# Patient Record
Sex: Female | Born: 1975 | Race: White | Hispanic: No | Marital: Married | Smoking: Never smoker
Health system: Southern US, Community
[De-identification: ages and names within clinical notes are randomized; demographics above are authoritative.]

## PROBLEM LIST (undated history)

## (undated) HISTORY — PX: NO PAST SURGERIES: SHX2092

---

## 2015-10-22 ENCOUNTER — Encounter: Payer: Self-pay | Admitting: Obstetrics and Gynecology

## 2015-11-03 ENCOUNTER — Other Ambulatory Visit: Payer: BC Managed Care – PPO

## 2015-11-03 ENCOUNTER — Encounter: Payer: Self-pay | Admitting: Obstetrics and Gynecology

## 2015-11-03 ENCOUNTER — Ambulatory Visit (INDEPENDENT_AMBULATORY_CARE_PROVIDER_SITE_OTHER): Payer: BC Managed Care – PPO | Admitting: Obstetrics and Gynecology

## 2015-11-03 VITALS — BP 117/78 | HR 91 | Ht 68.0 in | Wt 229.2 lb

## 2015-11-03 DIAGNOSIS — N912 Amenorrhea, unspecified: Secondary | ICD-10-CM | POA: Diagnosis not present

## 2015-11-03 DIAGNOSIS — Z3481 Encounter for supervision of other normal pregnancy, first trimester: Secondary | ICD-10-CM

## 2015-11-03 DIAGNOSIS — O09521 Supervision of elderly multigravida, first trimester: Secondary | ICD-10-CM | POA: Diagnosis not present

## 2015-11-03 DIAGNOSIS — O09529 Supervision of elderly multigravida, unspecified trimester: Secondary | ICD-10-CM | POA: Insufficient documentation

## 2015-11-03 DIAGNOSIS — N926 Irregular menstruation, unspecified: Secondary | ICD-10-CM

## 2015-11-03 DIAGNOSIS — R638 Other symptoms and signs concerning food and fluid intake: Secondary | ICD-10-CM | POA: Diagnosis not present

## 2015-11-03 DIAGNOSIS — Z3490 Encounter for supervision of normal pregnancy, unspecified, unspecified trimester: Secondary | ICD-10-CM | POA: Insufficient documentation

## 2015-11-03 LAB — POCT URINE PREGNANCY: PREG TEST UR: POSITIVE — AB

## 2015-11-03 NOTE — Progress Notes (Signed)
GYN ENCOUNTER NOTE  Subjective:       Alison Parks is a 40 y.o. 512P1001 female is here for gynecologic evaluation of the following issues:  1Pregnancy confirmation.     Patient was attempting to conceive. She has been taking prenatal vitamins prior to conception.  Patient denies vaginal bleeding, pelvic pain, vomiting, and breast tenderness. She does have mild nausea.  Prenatal risk factors: 1. Advanced maternal age 40. Increased body mass index  Gynecologic History Patient's last menstrual period was 09/02/2015 (exact date). Contraception: none  Obstetric History OB History  Gravida Para Term Preterm AB SAB TAB Ectopic Multiple Living  2 1 1       1     # Outcome Date GA Lbr Len/2nd Weight Sex Delivery Anes PTL Lv  2 Current           1 Term 2007   8 lb 12.8 oz (3.992 kg) M Vag-Spont   Y      History reviewed. No pertinent past medical history.  Past Surgical History  Procedure Laterality Date  . No past surgeries      No current outpatient prescriptions on file prior to visit.   No current facility-administered medications on file prior to visit.    Allergies  Allergen Reactions  . Amoxicillin Rash    Social History   Social History  . Marital Status: Married    Spouse Name: N/A  . Number of Children: N/A  . Years of Education: N/A   Occupational History  . Not on file.   Social History Main Topics  . Smoking status: Never Smoker   . Smokeless tobacco: Not on file  . Alcohol Use: No  . Drug Use: No  . Sexual Activity: Yes    Birth Control/ Protection: None   Other Topics Concern  . Not on file   Social History Narrative  . No narrative on file    Family History  Problem Relation Age of Onset  . Diabetes Father   . Congestive Heart Failure Father   . Breast cancer Paternal Grandmother   . Ovarian cancer Neg Hx   . Colon cancer Neg Hx     The following portions of the patient's history were reviewed and updated as appropriate:  allergies, current medications, past family history, past medical history, past social history, past surgical history and problem list.  Review of Systems  Review of Systems - General ROS: negative for - chills, fatigue, fever, hot flashes, malaise or night sweats Hematological and Lymphatic ROS: negative for - bleeding problems or swollen lymph nodes Gastrointestinal ROS: negative for - abdominal pain, blood in stools, change in bowel habits and vomiting. POSITIVE-nausea Musculoskeletal ROS: negative for - joint pain, muscle pain or muscular weakness Genito-Urinary ROS: negative for - change in menstrual cycle, dysmenorrhea, dyspareunia, dysuria, genital discharge, genital ulcers, hematuria, incontinence, irregular/heavy menses, nocturia or pelvic pain  Objective:   BP 117/78 mmHg  Pulse 91  Ht 5\' 8"  (1.727 m)  Wt 229 lb 3.2 oz (103.964 kg)  BMI 34.86 kg/m2  LMP 09/02/2015 (Exact Date) CONSTITUTIONAL: Well-developed, well-nourished female in no acute distress.  Physical exam deferred    Assessment:   1. Amenorrhea - POCT urine pregnancy  2. Pregnancy  LMP 09/03/2015  EDD 06/08/2016  EGA 8.6 weeks  3. Increased BMI First pregnancy uncomplicated without history of gestational diabetes mellitus, hypertension, preterm labor   4. Advanced maternal age in multigravida, first trimester No known genetic risk factors. Patient is interested in  Cell free DNA testing     Plan:   1. Continue prenatal vitamins 2. Return in 2 weeks for nursing intake visit 3. Return in 3 weeks to see Melody Shambley for new OB history and physical 4. Patient desires Cell Free DNA testing 5. New OB counseling: The patient has been given an overview regarding routine prenatal care. Recommendations regarding diet, weight gain, and exercise in pregnancy were given. Prenatal testing, optional genetic testing, and ultrasound use in pregnancy were reviewed.  Benefits of Breast Feeding were discussed. The  patient is encouraged to consider nursing her baby post partum.  A total of 30 minutes were spent face-to-face with the patient during the encounter with greater than 50% dealing with counseling and coordination of care.   Herold HarmsMartin A Wray Goehring, MD  Note: This dictation was prepared with Dragon dictation along with smaller phrase technology. Any transcriptional errors that result from this process are unintentional.

## 2015-11-03 NOTE — Patient Instructions (Signed)
1. Return in 2 weeks for nursing intake visit 2. Return in 3 weeks for NEW OB history and physical-[facility]

## 2015-11-04 LAB — MONITOR DRUG PROFILE 14(MW)
AMPHETAMINE SCREEN URINE: NEGATIVE ng/mL
BARBITURATE SCREEN URINE: NEGATIVE ng/mL
BENZODIAZEPINE SCREEN, URINE: NEGATIVE ng/mL
BUPRENORPHINE, URINE: NEGATIVE ng/mL
CANNABINOIDS UR QL SCN: NEGATIVE ng/mL
Cocaine (Metab) Scrn, Ur: NEGATIVE ng/mL
Creatinine(Crt), U: 83.1 mg/dL (ref 20.0–300.0)
Fentanyl, Urine: NEGATIVE pg/mL
Meperidine Screen, Urine: NEGATIVE ng/mL
Methadone Screen, Urine: NEGATIVE ng/mL
OXYCODONE+OXYMORPHONE UR QL SCN: NEGATIVE ng/mL
Opiate Scrn, Ur: NEGATIVE ng/mL
PH UR, DRUG SCRN: 6.2 (ref 4.5–8.9)
PHENCYCLIDINE QUANTITATIVE URINE: NEGATIVE ng/mL
PROPOXYPHENE SCREEN URINE: NEGATIVE ng/mL
SPECIFIC GRAVITY: 1.015
Tramadol Screen, Urine: NEGATIVE ng/mL

## 2015-11-04 LAB — CBC WITH DIFFERENTIAL/PLATELET
BASOS ABS: 0 10*3/uL (ref 0.0–0.2)
Basos: 0 %
EOS (ABSOLUTE): 0.1 10*3/uL (ref 0.0–0.4)
Eos: 1 %
HEMOGLOBIN: 14.1 g/dL (ref 11.1–15.9)
Hematocrit: 42.4 % (ref 34.0–46.6)
IMMATURE GRANS (ABS): 0 10*3/uL (ref 0.0–0.1)
Immature Granulocytes: 0 %
LYMPHS: 20 %
Lymphocytes Absolute: 2 10*3/uL (ref 0.7–3.1)
MCH: 29.5 pg (ref 26.6–33.0)
MCHC: 33.3 g/dL (ref 31.5–35.7)
MCV: 89 fL (ref 79–97)
MONOCYTES: 6 %
Monocytes Absolute: 0.6 10*3/uL (ref 0.1–0.9)
NEUTROS PCT: 73 %
Neutrophils Absolute: 7 10*3/uL (ref 1.4–7.0)
Platelets: 208 10*3/uL (ref 150–379)
RBC: 4.78 x10E6/uL (ref 3.77–5.28)
RDW: 13.9 % (ref 12.3–15.4)
WBC: 9.7 10*3/uL (ref 3.4–10.8)

## 2015-11-04 LAB — URINALYSIS, ROUTINE W REFLEX MICROSCOPIC
Bilirubin, UA: NEGATIVE
GLUCOSE, UA: NEGATIVE
Ketones, UA: NEGATIVE
Nitrite, UA: NEGATIVE
PROTEIN UA: NEGATIVE
RBC, UA: NEGATIVE
Specific Gravity, UA: 1.012 (ref 1.005–1.030)
UUROB: 0.2 mg/dL (ref 0.2–1.0)
pH, UA: 6.5 (ref 5.0–7.5)

## 2015-11-04 LAB — VARICELLA ZOSTER ANTIBODY, IGG: Varicella zoster IgG: 505 index (ref >165)

## 2015-11-04 LAB — RUBELLA SCREEN: RUBELLA: 5.25 {index} (ref >0.99)

## 2015-11-04 LAB — URINE CULTURE

## 2015-11-04 LAB — HEP, RPR, HIV PANEL
HEP B S AG: NEGATIVE
HIV Screen 4th Generation wRfx: NONREACTIVE
RPR: NONREACTIVE

## 2015-11-04 LAB — HEMOGLOBIN A1C
ESTIMATED AVERAGE GLUCOSE: 100 mg/dL
Hgb A1c MFr Bld: 5.1 % (ref 4.8–5.6)

## 2015-11-04 LAB — TSH: TSH: 1.1 u[IU]/mL (ref 0.450–4.500)

## 2015-11-04 LAB — MICROSCOPIC EXAMINATION: Casts: NONE SEEN /lpf

## 2015-11-04 LAB — ABO AND RH: Rh Factor: NEGATIVE

## 2015-11-04 LAB — ANTIBODY SCREEN: ANTIBODY SCREEN: NEGATIVE

## 2015-11-05 ENCOUNTER — Other Ambulatory Visit: Payer: Self-pay | Admitting: Obstetrics and Gynecology

## 2015-11-05 DIAGNOSIS — O360190 Maternal care for anti-D [Rh] antibodies, unspecified trimester, not applicable or unspecified: Secondary | ICD-10-CM

## 2015-11-05 DIAGNOSIS — Z6791 Unspecified blood type, Rh negative: Secondary | ICD-10-CM | POA: Insufficient documentation

## 2015-11-05 DIAGNOSIS — O26899 Other specified pregnancy related conditions, unspecified trimester: Secondary | ICD-10-CM

## 2015-11-05 LAB — GC/CHLAMYDIA PROBE AMP
CHLAMYDIA, DNA PROBE: NEGATIVE
NEISSERIA GONORRHOEAE BY PCR: NEGATIVE

## 2015-12-03 ENCOUNTER — Ambulatory Visit (INDEPENDENT_AMBULATORY_CARE_PROVIDER_SITE_OTHER): Payer: BC Managed Care – PPO | Admitting: Obstetrics and Gynecology

## 2015-12-03 ENCOUNTER — Encounter: Payer: Self-pay | Admitting: Obstetrics and Gynecology

## 2015-12-03 VITALS — BP 138/82 | HR 98 | Wt 226.2 lb

## 2015-12-03 DIAGNOSIS — Z3492 Encounter for supervision of normal pregnancy, unspecified, second trimester: Secondary | ICD-10-CM

## 2015-12-03 LAB — POCT URINALYSIS DIPSTICK
BILIRUBIN UA: NEGATIVE
Blood, UA: NEGATIVE
GLUCOSE UA: NEGATIVE
KETONES UA: NEGATIVE
LEUKOCYTES UA: NEGATIVE
NITRITE UA: NEGATIVE
Protein, UA: NEGATIVE
SPEC GRAV UA: 1.015
Urobilinogen, UA: 0.2
pH, UA: 6

## 2015-12-03 NOTE — Progress Notes (Signed)
NEW OB HISTORY AND PHYSICAL  SUBJECTIVE:       Alison Parks is a 40 y.o. G51P1001 female, Patient's last menstrual period was 09/02/2015 (exact date)., Estimated Date of Delivery: 06/08/16, [redacted]w[redacted]d, presents today for establishment of Prenatal Care. She has no unusual complaints and complains of nothing now      Gynecologic History Patient's last menstrual period was 09/02/2015 (exact date). Normal Contraception: none Last Pap: 2016. Results were: normal  Obstetric History OB History  Gravida Para Term Preterm AB Living  2 1 1     1   SAB TAB Ectopic Multiple Live Births          1    # Outcome Date GA Lbr Len/2nd Weight Sex Delivery Anes PTL Lv  2 Current           1 Term 2007   8 lb 12.8 oz (3.992 kg) M Vag-Spont   LIV      No past medical history on file.  Past Surgical History:  Procedure Laterality Date  . NO PAST SURGERIES      Current Outpatient Prescriptions on File Prior to Visit  Medication Sig Dispense Refill  . Prenatal Vit-Fe Fumarate-FA (PRENATAL MULTIVITAMIN) TABS tablet Take 1 tablet by mouth daily at 12 noon.     No current facility-administered medications on file prior to visit.     Allergies  Allergen Reactions  . Amoxicillin Rash    Social History   Social History  . Marital status: Married    Spouse name: N/A  . Number of children: N/A  . Years of education: N/A   Occupational History  . Not on file.   Social History Main Topics  . Smoking status: Never Smoker  . Smokeless tobacco: Not on file  . Alcohol use No  . Drug use: No  . Sexual activity: Yes    Birth control/ protection: None   Other Topics Concern  . Not on file   Social History Narrative  . No narrative on file    Family History  Problem Relation Age of Onset  . Diabetes Father   . Congestive Heart Failure Father   . Breast cancer Paternal Grandmother   . Ovarian cancer Neg Hx   . Colon cancer Neg Hx     The following portions of the patient's history were  reviewed and updated as appropriate: allergies, current medications, past OB history, past medical history, past surgical history, past family history, past social history, and problem list.    OBJECTIVE: Initial Physical Exam (New OB)  GENERAL APPEARANCE: alert, well appearing, in no apparent distress, oriented to person, place and time HEAD: normocephalic, atraumatic MOUTH: mucous membranes moist, pharynx normal without lesions and dental hygiene good THYROID: no thyromegaly or masses present BREASTS: no masses noted, no significant tenderness, no palpable axillary nodes, no skin changes LUNGS: clear to auscultation, no wheezes, rales or rhonchi, symmetric air entry HEART: regular rate and rhythm, no murmurs ABDOMEN: soft, nontender, nondistended, no abnormal masses, no epigastric pain, fundus not palpable and FHT present EXTREMITIES: no redness or tenderness in the calves or thighs SKIN: normal coloration and turgor, no rashes LYMPH NODES: no adenopathy palpable NEUROLOGIC: alert, oriented, normal speech, no focal findings or movement disorder noted  PELVIC EXAM not indicated  ASSESSMENT: Normal pregnancy AMA Elevated BMI  PLAN: Prenatal care-routine Early glucola Genetic screening desired- panarama and Horizons obtained today See orders,

## 2015-12-03 NOTE — Patient Instructions (Signed)

## 2015-12-03 NOTE — Progress Notes (Signed)
NOB physical- pt is doing well denies any complaints

## 2015-12-14 ENCOUNTER — Encounter: Payer: Self-pay | Admitting: Obstetrics and Gynecology

## 2015-12-21 ENCOUNTER — Encounter: Payer: Self-pay | Admitting: Obstetrics and Gynecology

## 2015-12-28 ENCOUNTER — Ambulatory Visit (INDEPENDENT_AMBULATORY_CARE_PROVIDER_SITE_OTHER): Payer: BC Managed Care – PPO | Admitting: Obstetrics and Gynecology

## 2015-12-28 VITALS — BP 140/82 | HR 106 | Wt 228.3 lb

## 2015-12-28 DIAGNOSIS — Z3492 Encounter for supervision of normal pregnancy, unspecified, second trimester: Secondary | ICD-10-CM

## 2015-12-28 LAB — POCT URINALYSIS DIPSTICK
BILIRUBIN UA: NEGATIVE
Blood, UA: NEGATIVE
GLUCOSE UA: NEGATIVE
KETONES UA: NEGATIVE
LEUKOCYTES UA: NEGATIVE
Nitrite, UA: NEGATIVE
Spec Grav, UA: 1.015
Urobilinogen, UA: 0.2
pH, UA: 6

## 2015-12-28 NOTE — Progress Notes (Signed)
ROB- doing well, discussed normal genetic screening.

## 2015-12-28 NOTE — Progress Notes (Signed)
ROB- pt is doing well denies any complaints 

## 2016-01-05 ENCOUNTER — Other Ambulatory Visit: Payer: BC Managed Care – PPO

## 2016-01-19 ENCOUNTER — Ambulatory Visit (INDEPENDENT_AMBULATORY_CARE_PROVIDER_SITE_OTHER): Payer: BC Managed Care – PPO

## 2016-01-19 DIAGNOSIS — Z3492 Encounter for supervision of normal pregnancy, unspecified, second trimester: Secondary | ICD-10-CM | POA: Diagnosis not present

## 2016-01-20 ENCOUNTER — Other Ambulatory Visit: Payer: Self-pay | Admitting: Obstetrics and Gynecology

## 2016-01-20 ENCOUNTER — Ambulatory Visit (INDEPENDENT_AMBULATORY_CARE_PROVIDER_SITE_OTHER): Payer: BC Managed Care – PPO | Admitting: Obstetrics and Gynecology

## 2016-01-20 ENCOUNTER — Ambulatory Visit (INDEPENDENT_AMBULATORY_CARE_PROVIDER_SITE_OTHER): Payer: BC Managed Care – PPO

## 2016-01-20 VITALS — BP 117/79 | HR 103 | Wt 231.2 lb

## 2016-01-20 DIAGNOSIS — Z0489 Encounter for examination and observation for other specified reasons: Secondary | ICD-10-CM

## 2016-01-20 DIAGNOSIS — Z36 Encounter for antenatal screening of mother: Secondary | ICD-10-CM | POA: Diagnosis not present

## 2016-01-20 DIAGNOSIS — IMO0002 Reserved for concepts with insufficient information to code with codable children: Secondary | ICD-10-CM

## 2016-01-20 DIAGNOSIS — Z3492 Encounter for supervision of normal pregnancy, unspecified, second trimester: Secondary | ICD-10-CM

## 2016-01-20 NOTE — Progress Notes (Signed)
ROB- pt is doing well,f/up anatomy

## 2016-01-20 NOTE — Progress Notes (Signed)
ROB- doing well except some sciatic pains;  Declines flu vaccine after counseling.                                           Location: [facility] Date of Service: 9/12 and 01/20/16  Indications: Anatomy Findings:  Singleton intrauterine pregnancy is visualized with FHR at 143 BPM. Biometrics give an (U/S) Gestational age of [redacted] weeks and 3 days, and an (U/S) EDD of 06/11/16; this correlates with the clinically established EDD of 06/08/16.  Fetal presentation is breech, spine variable.  EFW: 299 grams ( 0 lbs. 11 oz. ). Placenta: Posterior, grade 0 and remote to cervix by 4.5 cm. AFI: Subjectively adequate with a MVP of 4.4 cm..  Anatomic survey is complete and appears WNL. Gender - Female.   Right and left ovaries were not visualized. There is no evidence of a corpus luteal cyst.. Survey of the adnexa demonstrates no adnexal masses. There is no free peritoneal fluid in the cul de sac.  Impression: 1. 19 week 3 day Viable Singleton Intrauterine pregnancy by U/S. 2. (U/S) EDD is consistent with Clinically established (LMP) EDD of 06/08/16. 3. Normal appearing anatomy scan, although technically difficult due to patient body habitus and early fetal age.

## 2016-02-19 ENCOUNTER — Ambulatory Visit (INDEPENDENT_AMBULATORY_CARE_PROVIDER_SITE_OTHER): Payer: BC Managed Care – PPO | Admitting: Obstetrics and Gynecology

## 2016-02-19 VITALS — BP 118/75 | HR 68 | Wt 234.4 lb

## 2016-02-19 DIAGNOSIS — Z3492 Encounter for supervision of normal pregnancy, unspecified, second trimester: Secondary | ICD-10-CM

## 2016-02-19 LAB — POCT URINALYSIS DIPSTICK
Bilirubin, UA: NEGATIVE
Blood, UA: NEGATIVE
Glucose, UA: NEGATIVE
Ketones, UA: NEGATIVE
LEUKOCYTES UA: NEGATIVE
NITRITE UA: NEGATIVE
PH UA: 7.5
PROTEIN UA: 6.5
Spec Grav, UA: 1.015
UROBILINOGEN UA: 0.2

## 2016-02-19 NOTE — Progress Notes (Signed)
ROB-doing well, glucola next visit. 

## 2016-02-19 NOTE — Progress Notes (Signed)
ROB- pt is doing well 

## 2016-03-25 ENCOUNTER — Ambulatory Visit (INDEPENDENT_AMBULATORY_CARE_PROVIDER_SITE_OTHER): Payer: BC Managed Care – PPO | Admitting: Obstetrics and Gynecology

## 2016-03-25 ENCOUNTER — Other Ambulatory Visit: Payer: BC Managed Care – PPO

## 2016-03-25 VITALS — BP 133/80 | HR 86 | Wt 242.6 lb

## 2016-03-25 DIAGNOSIS — Z3493 Encounter for supervision of normal pregnancy, unspecified, third trimester: Secondary | ICD-10-CM

## 2016-03-25 DIAGNOSIS — Z131 Encounter for screening for diabetes mellitus: Secondary | ICD-10-CM

## 2016-03-25 LAB — POCT URINALYSIS DIPSTICK
Bilirubin, UA: NEGATIVE
Blood, UA: NEGATIVE
GLUCOSE UA: NEGATIVE
Ketones, UA: NEGATIVE
LEUKOCYTES UA: NEGATIVE
NITRITE UA: NEGATIVE
PROTEIN UA: NEGATIVE
SPEC GRAV UA: 1.01
UROBILINOGEN UA: 0.2
pH, UA: 6

## 2016-03-25 NOTE — Progress Notes (Signed)
ROB- glucola done today, blood consent signed, wants to wait on tdap Pt is doing well

## 2016-03-25 NOTE — Progress Notes (Signed)
ROB- Patient reports doing well. Denies any complaints. Is here for 1 hour glucola. Will follow up in 3 weeks or sooner if needed.

## 2016-03-25 NOTE — Addendum Note (Signed)
Addended by: Rosine BeatLONTZ, Tonyia Marschall L on: 03/25/2016 04:45 PM   Modules accepted: Orders

## 2016-03-25 NOTE — Patient Instructions (Addendum)
  Place 24-38 weeks prenatal visit patient instructions here.  Thank you for enrolling in MyChart. Please follow the instructions below to securely access your online medical record. MyChart allows you to send messages to your doctor, view your test results, manage appointments, and more.   How Do I Sign Up? 1. In your Internet browser, go to [employer] Address [employer] and enter [email or web address]. 2. Click on the Sign Up Now link in the Sign In box. You will see the New Member Sign Up page. 3. Enter your MyChart Access Code exactly as it appears below. You will not need to use this code after you've completed the sign-up process. If you do not sign up before the expiration date, you must request a new code.  MyChart Access Code: [email or web address] Expires: 05/24/2016  3:33 PM  4. Enter your Social Security Number ([id number]) and Date of Birth (mm/dd/yyyy) as indicated and click Submit. You will be taken to the next sign-up page. 5. Create a MyChart ID. This will be your MyChart login ID and cannot be changed, so think of one that is secure and easy to remember. 6. Create a MyChart password. You can change your password at any time. 7. Enter your Password Reset Question and Answer. This can be used at a later time if you forget your password.  8. Enter your e-mail address. You will receive e-mail notification when new information is available in MyChart. 9. Click Sign Up. You can now view your medical record.   Additional Information Remember, MyChart is NOT to be used for urgent needs. For medical emergencies, dial 911.

## 2016-03-26 LAB — HEMOGLOBIN AND HEMATOCRIT, BLOOD
HEMOGLOBIN: 11.9 g/dL (ref 11.1–15.9)
Hematocrit: 35.7 % (ref 34.0–46.6)

## 2016-03-26 LAB — GLUCOSE, 1 HOUR GESTATIONAL: GESTATIONAL DIABETES SCREEN: 81 mg/dL (ref 65–139)

## 2016-03-29 ENCOUNTER — Telehealth: Payer: Self-pay | Admitting: *Deleted

## 2016-03-29 NOTE — Telephone Encounter (Signed)
Left pt detailed message about labs 

## 2016-03-29 NOTE — Telephone Encounter (Signed)
-----   Message from Purcell NailsMelody N Shambley, [place] sent at 03/29/2016 12:02 PM EST ----- Please let her know she passed her glucola and no signs of anemia

## 2016-04-14 ENCOUNTER — Ambulatory Visit (INDEPENDENT_AMBULATORY_CARE_PROVIDER_SITE_OTHER): Payer: BC Managed Care – PPO | Admitting: Obstetrics and Gynecology

## 2016-04-14 VITALS — BP 122/71 | HR 82 | Wt 247.4 lb

## 2016-04-14 DIAGNOSIS — Z3493 Encounter for supervision of normal pregnancy, unspecified, third trimester: Secondary | ICD-10-CM

## 2016-04-14 LAB — POCT URINALYSIS DIPSTICK
Bilirubin, UA: NEGATIVE
Blood, UA: NEGATIVE
Glucose, UA: NEGATIVE
KETONES UA: NEGATIVE
LEUKOCYTES UA: NEGATIVE
NITRITE UA: NEGATIVE
PH UA: 6.5
PROTEIN UA: NEGATIVE
Spec Grav, UA: 1.02
UROBILINOGEN UA: 0.2

## 2016-04-14 NOTE — Progress Notes (Signed)
ROB- discussed BC and breast feeding, spouse for labor support; natural childbirth,

## 2016-04-14 NOTE — Progress Notes (Signed)
ROB- pt is doing well, is feeling some pressure in pelvic area

## 2016-04-28 ENCOUNTER — Ambulatory Visit (INDEPENDENT_AMBULATORY_CARE_PROVIDER_SITE_OTHER): Payer: BC Managed Care – PPO | Admitting: Certified Nurse Midwife

## 2016-04-28 ENCOUNTER — Encounter: Payer: BC Managed Care – PPO | Admitting: Obstetrics and Gynecology

## 2016-04-28 VITALS — BP 129/74 | HR 92 | Wt 248.9 lb

## 2016-04-28 DIAGNOSIS — Z3493 Encounter for supervision of normal pregnancy, unspecified, third trimester: Secondary | ICD-10-CM

## 2016-04-28 LAB — POCT URINALYSIS DIPSTICK
BILIRUBIN UA: NEGATIVE
Blood, UA: NEGATIVE
GLUCOSE UA: NEGATIVE
KETONES UA: NEGATIVE
LEUKOCYTES UA: NEGATIVE
Nitrite, UA: NEGATIVE
PROTEIN UA: NEGATIVE
SPEC GRAV UA: 1.015
Urobilinogen, UA: 0.2
pH, UA: 6

## 2016-04-28 NOTE — Progress Notes (Signed)
ROB- pt is doing ok, having some pressure in her pelvic area

## 2016-04-28 NOTE — Progress Notes (Signed)
ROB: Pt is doing well. Reviewed use of abdominal support. Discussed preterm labor precautions and when to call. RTC x 2 wks for ROB including GBS and culture collection, pt aware.

## 2016-05-13 ENCOUNTER — Other Ambulatory Visit (INDEPENDENT_AMBULATORY_CARE_PROVIDER_SITE_OTHER): Payer: BC Managed Care – PPO

## 2016-05-13 ENCOUNTER — Other Ambulatory Visit: Payer: Self-pay | Admitting: Obstetrics and Gynecology

## 2016-05-13 ENCOUNTER — Ambulatory Visit (INDEPENDENT_AMBULATORY_CARE_PROVIDER_SITE_OTHER): Payer: BC Managed Care – PPO | Admitting: Certified Nurse Midwife

## 2016-05-13 ENCOUNTER — Other Ambulatory Visit: Payer: Self-pay | Admitting: Certified Nurse Midwife

## 2016-05-13 VITALS — BP 130/82 | HR 91 | Wt 254.0 lb

## 2016-05-13 DIAGNOSIS — Z3689 Encounter for other specified antenatal screening: Secondary | ICD-10-CM | POA: Diagnosis not present

## 2016-05-13 DIAGNOSIS — Z3493 Encounter for supervision of normal pregnancy, unspecified, third trimester: Secondary | ICD-10-CM

## 2016-05-13 NOTE — Progress Notes (Signed)
ROB-Pt tired, but doing well. Reviewed s/s of labor and when to call. US for presentation, VTX confirmed. GBS and cultures collected. RTC x 1 wk.   ULTRASOUND REPORT  Location: [facility] Date of Service: 05/13/16  Indications: Presentation Findings:  Mason JimSingleton intrauterine pregnancy is visualized with FHR at 149 BPM.   Fetal presentation is vertex, spine left lateral.  AFI: Adequate at 11.4 cm.   Impression: 1. Fetus in the cephalic position with spine to maternal left. 2. AFI adequate at 11.4 cm.  Recommendations: 1.Clinical correlation with the patient's History and Physical Exam.   Alison Humphreyeresa Elliott, RDMS, RVT  Report reviewed and agree with the above findings.   Alison BullaJenkins Michelle Dhiya Parks, CNM

## 2016-05-13 NOTE — Progress Notes (Signed)
Pt was unable to leave urine specimen

## 2016-05-18 LAB — GC/CHLAMYDIA PROBE AMP
CHLAMYDIA, DNA PROBE: NEGATIVE
Neisseria gonorrhoeae by PCR: NEGATIVE

## 2016-05-18 LAB — STREP GP B NAA+RFLX: Strep Gp B NAA+Rflx: NEGATIVE

## 2016-05-19 ENCOUNTER — Encounter: Payer: Self-pay | Admitting: Certified Nurse Midwife

## 2016-05-19 ENCOUNTER — Ambulatory Visit (INDEPENDENT_AMBULATORY_CARE_PROVIDER_SITE_OTHER): Payer: BC Managed Care – PPO | Admitting: Certified Nurse Midwife

## 2016-05-19 VITALS — BP 119/74 | HR 99 | Temp 98.0°F | Wt 257.3 lb

## 2016-05-19 DIAGNOSIS — Z3493 Encounter for supervision of normal pregnancy, unspecified, third trimester: Secondary | ICD-10-CM

## 2016-05-19 LAB — POCT URINALYSIS DIPSTICK
Bilirubin, UA: NEGATIVE
GLUCOSE UA: NEGATIVE
Ketones, UA: NEGATIVE
LEUKOCYTES UA: NEGATIVE
NITRITE UA: NEGATIVE
Protein, UA: NEGATIVE
RBC UA: NEGATIVE
Spec Grav, UA: 1.02
UROBILINOGEN UA: NEGATIVE
pH, UA: 5

## 2016-05-19 NOTE — Progress Notes (Signed)
Pt is here with cold symptoms. Feels like it will settle in chest.

## 2016-05-19 NOTE — Progress Notes (Signed)
ROB-Pt doing well pregnancy wise. Using Tylenol and essential oils to treat cold symptoms x 1 day. Reports relief, discussed use of OTC medications as needed. Reviewed labor precautions, pt verbalized understanding. RTC x 1 week. Work note sent via [employer] for sick day.

## 2016-05-24 ENCOUNTER — Telehealth: Payer: Self-pay | Admitting: Obstetrics and Gynecology

## 2016-05-24 NOTE — Telephone Encounter (Signed)
PT CALLED AND SHE HAS BEEN SICK, SHE THOUGHT SHE WAS GETTING BETTER LAST WEEK WHEN SHE SAW MICHELLE BUT SHE IS NOT, SHE IS HAVING SINUS SYMPTOMS BUT HER THRAT IS VERY SORE, NO FEVER, COUGHING MUCUS AND SOME BLOOD IN WITH IT. SHE WASN'T SURE IF SHE NEEDED TO COME IN OR IF SOMETHING COULD BE SENT IN FOR HER, SHE IS [redacted] WEEKS PREGNANT TOMORROW

## 2016-05-24 NOTE — Telephone Encounter (Signed)
Please let her know we cant really send in anything to help, and if see is getting more sick I want her tot go into ED to be seen. She may need a breathing treatment and they can give it to her there.

## 2016-05-26 ENCOUNTER — Encounter (HOSPITAL_COMMUNITY): Payer: Self-pay | Admitting: Emergency Medicine

## 2016-05-26 ENCOUNTER — Encounter: Payer: BC Managed Care – PPO | Admitting: Certified Nurse Midwife

## 2016-05-26 ENCOUNTER — Ambulatory Visit (HOSPITAL_COMMUNITY)
Admission: EM | Admit: 2016-05-26 | Discharge: 2016-05-26 | Disposition: A | Discharge status: Home / Self Care | Payer: BC Managed Care – PPO | Attending: Emergency Medicine | Admitting: Emergency Medicine

## 2016-05-26 DIAGNOSIS — O26893 Other specified pregnancy related conditions, third trimester: Secondary | ICD-10-CM | POA: Diagnosis not present

## 2016-05-26 DIAGNOSIS — J069 Acute upper respiratory infection, unspecified: Secondary | ICD-10-CM | POA: Diagnosis present

## 2016-05-26 DIAGNOSIS — J00 Acute nasopharyngitis [common cold]: Secondary | ICD-10-CM | POA: Diagnosis not present

## 2016-05-26 DIAGNOSIS — Z3A38 38 weeks gestation of pregnancy: Secondary | ICD-10-CM | POA: Diagnosis not present

## 2016-05-26 LAB — POCT RAPID STREP A: Streptococcus, Group A Screen (Direct): NEGATIVE

## 2016-05-26 MED ORDER — IPRATROPIUM BROMIDE 0.06 % NA SOLN: 2.0000 | Freq: Four times a day (QID) | NASAL | 0 refills | Status: DC

## 2016-05-26 MED ORDER — LIDOCAINE VISCOUS 2 % MT SOLN: 20.0000 mL | OROMUCOSAL | 0 refills | Status: DC | PRN

## 2016-05-26 NOTE — ED Triage Notes (Signed)
Pt c/o cold sx onset: 10 days  Reports she [redacted] weeks pregnant   Sx include: ST, prod cough, nasal drainage, odynophagia  Denies: fevers  Taking: acetaminophen at 0700 today  A&O x4... NAD

## 2016-05-26 NOTE — ED Provider Notes (Signed)
CSN: [id number]     Arrival date & time 05/26/16  1516 History   First MD Initiated Contact with Patient 05/26/16 1609     Chief Complaint  Patient presents with  . URI   (Consider location/radiation/quality/duration/timing/severity/associated sxs/prior Treatment) Patient c/o sore throat and uri sx's for 3 days.   The history is provided by the patient.  URI  Presenting symptoms: congestion and sore throat   Severity:  Moderate Onset quality:  Sudden Duration:  3 days Timing:  Constant Progression:  Worsening Chronicity:  New Relieved by:  Nothing Worsened by:  Nothing Ineffective treatments:  None tried   History reviewed. No pertinent past medical history. Past Surgical History:  Procedure Laterality Date  . NO PAST SURGERIES     Family History  Problem Relation Age of Onset  . Diabetes Father   . Congestive Heart Failure Father   . Breast cancer Paternal Grandmother   . Ovarian cancer Neg Hx   . Colon cancer Neg Hx    Social History  Substance Use Topics  . Smoking status: Never Smoker  . Smokeless tobacco: Never Used  . Alcohol use No   OB History    Gravida Para Term Preterm AB Living   2 1 1     1    SAB TAB Ectopic Multiple Live Births           1     Review of Systems  HENT: Positive for congestion and sore throat.   Eyes: Negative.   Respiratory: Negative.   Cardiovascular: Negative.   Gastrointestinal: Negative.   Endocrine: Negative.   Genitourinary: Negative.   Musculoskeletal: Negative.     Allergies  Amoxicillin  Home Medications   Prior to Admission medications   Medication Sig Start Date End Date Taking? Authorizing Provider  Prenatal Vit-Fe Fumarate-FA (PRENATAL MULTIVITAMIN) TABS tablet Take 1 tablet by mouth daily at 12 noon.   Yes Historical Provider, MD  ipratropium (ATROVENT) 0.06 % nasal spray Place 2 sprays into both nostrils 4 (four) times daily. 05/26/16   Deatra CanterWilliam J Oxford, FNP  lidocaine (XYLOCAINE) 2 % solution Use as  directed 20 mLs in the mouth or throat as needed for mouth pain. 05/26/16   Deatra CanterWilliam J Oxford, FNP   Meds Ordered and Administered this Visit  Medications - No data to display  BP 126/68 (BP Location: Left Arm)   Pulse 66   Temp 98.2 F (36.8 C) (Oral)   Resp 20   LMP 09/02/2015 (Exact Date)   SpO2 99%  No data found.   Physical Exam  Constitutional: She appears well-developed and well-nourished.  HENT:  Head: Normocephalic and atraumatic.  Right Ear: External ear normal.  Left Ear: External ear normal.  Mouth/Throat: Oropharynx is clear and moist.  Eyes: Conjunctivae and EOM are normal. Pupils are equal, round, and reactive to light.  Neck: Normal range of motion. Neck supple.  Cardiovascular: Normal rate, regular rhythm and normal heart sounds.   Pulmonary/Chest: Effort normal and breath sounds normal.  Nursing note and vitals reviewed.   Urgent Care Course     Procedures (including critical care time)  Labs Review Labs Reviewed  POCT RAPID STREP A    Imaging Review No results found.   Visual Acuity Review  Right Eye Distance:   Left Eye Distance:   Bilateral Distance:    Right Eye Near:   Left Eye Near:    Bilateral Near:         MDM   1.  Acute nasopharyngitis    Atrovent Nasal Spray  Lidocaine viscous gargle and spit      Deatra Canter, FNP 05/26/16 1700

## 2016-05-27 ENCOUNTER — Encounter: Payer: Self-pay | Admitting: Certified Nurse Midwife

## 2016-05-27 ENCOUNTER — Ambulatory Visit (INDEPENDENT_AMBULATORY_CARE_PROVIDER_SITE_OTHER): Payer: BC Managed Care – PPO | Admitting: Certified Nurse Midwife

## 2016-05-27 VITALS — BP 144/72 | HR 91 | Wt 258.9 lb

## 2016-05-27 DIAGNOSIS — Z3483 Encounter for supervision of other normal pregnancy, third trimester: Secondary | ICD-10-CM

## 2016-05-27 LAB — POCT URINALYSIS DIPSTICK
BILIRUBIN UA: NEGATIVE
Glucose, UA: NEGATIVE
KETONES UA: NEGATIVE
Nitrite, UA: NEGATIVE
PH UA: 6
Protein, UA: NEGATIVE
RBC UA: NEGATIVE
SPEC GRAV UA: 1.01
Urobilinogen, UA: NEGATIVE

## 2016-05-27 NOTE — Progress Notes (Signed)
ROB-Pt doing well. Treating post nasal drip with essential oils. Requests paper work to start leave from work. Reviewed red flag symptoms and when to call. End of visit b/p recheck: 124/73, hr 89. RTC x 1 week or sooner if needed.

## 2016-05-27 NOTE — Progress Notes (Signed)
ROB- dx with post nasal drip at urgent care- 05/26/2016.

## 2016-05-29 LAB — CULTURE, GROUP A STREP (THRC)

## 2016-05-30 ENCOUNTER — Encounter: Payer: Self-pay | Admitting: Certified Nurse Midwife

## 2016-05-31 ENCOUNTER — Ambulatory Visit (INDEPENDENT_AMBULATORY_CARE_PROVIDER_SITE_OTHER): Payer: BC Managed Care – PPO

## 2016-05-31 ENCOUNTER — Ambulatory Visit (INDEPENDENT_AMBULATORY_CARE_PROVIDER_SITE_OTHER): Payer: BC Managed Care – PPO | Admitting: Obstetrics and Gynecology

## 2016-05-31 VITALS — BP 144/84 | HR 95 | Wt 262.6 lb

## 2016-05-31 DIAGNOSIS — Z3493 Encounter for supervision of normal pregnancy, unspecified, third trimester: Secondary | ICD-10-CM | POA: Diagnosis not present

## 2016-05-31 LAB — POCT URINALYSIS DIPSTICK
Bilirubin, UA: NEGATIVE
Blood, UA: NEGATIVE
GLUCOSE UA: NEGATIVE
KETONES UA: NEGATIVE
LEUKOCYTES UA: NEGATIVE
NITRITE UA: NEGATIVE
Spec Grav, UA: 1.01
Urobilinogen, UA: 0.2
pH, UA: 6.5

## 2016-05-31 NOTE — Progress Notes (Signed)
ROB- pt is feeling some better, having some pelvic pressure

## 2016-05-31 NOTE — Progress Notes (Signed)
ROB- doing well, BP rechecked and growth scan ordered for malpresentation and size> dates;  Will consider IOL at term if not delivered by then. At home on modified activity since last week, discussed maternal positioning for optimal fetal positioning. Ultrasound reveals: Indications:Growth and AFI Findings:  Singleton intrauterine pregnancy is visualized with FHR at 153 BPM. Biometrics give an (U/S) Gestational age of 39 6/7 weeks and an (U/S) EDD of 05/25/16  this correlates with the clinically established EDD of 06/08/16.  Fetal presentation is Vertex.  EFW: 4272 g (9lb 11 oz) . Placenta: Posterior, Grade 2, remote to cervix.. AFI: 18.9 cm. Upper limits of normal.  Impression: 1. 40 6/7 week Viable Singleton Intrauterine pregnancy by U/S. 2. (U/S) EDD is consistent with Clinically established (LMP) EDD of 06/08/16. 3. Macrosomia (too large for Williams percentile estimation)  With findings, will plan IOL tomorrow. Counseled at length about risks for trauma to infant and birth canal with LGA and option for elective c/s. Desires labor if possible.

## 2016-06-01 ENCOUNTER — Inpatient Hospital Stay
Admission: EM | Admit: 2016-06-01 | Discharge: 2016-06-04 | DRG: 766 | Disposition: A | Discharge status: Home / Self Care | Payer: BC Managed Care – PPO | Attending: Obstetrics and Gynecology | Admitting: Obstetrics and Gynecology

## 2016-06-01 DIAGNOSIS — Z3A39 39 weeks gestation of pregnancy: Secondary | ICD-10-CM

## 2016-06-01 DIAGNOSIS — Z3483 Encounter for supervision of other normal pregnancy, third trimester: Secondary | ICD-10-CM | POA: Diagnosis not present

## 2016-06-01 DIAGNOSIS — Z6839 Body mass index (BMI) 39.0-39.9, adult: Secondary | ICD-10-CM | POA: Diagnosis not present

## 2016-06-01 DIAGNOSIS — O99214 Obesity complicating childbirth: Secondary | ICD-10-CM | POA: Diagnosis present

## 2016-06-01 DIAGNOSIS — Z833 Family history of diabetes mellitus: Secondary | ICD-10-CM

## 2016-06-01 DIAGNOSIS — O3663X0 Maternal care for excessive fetal growth, third trimester, not applicable or unspecified: Secondary | ICD-10-CM | POA: Diagnosis present

## 2016-06-01 DIAGNOSIS — Z8249 Family history of ischemic heart disease and other diseases of the circulatory system: Secondary | ICD-10-CM | POA: Diagnosis not present

## 2016-06-01 DIAGNOSIS — O339 Maternal care for disproportion, unspecified: Secondary | ICD-10-CM | POA: Diagnosis present

## 2016-06-01 DIAGNOSIS — O324XX0 Maternal care for high head at term, not applicable or unspecified: Secondary | ICD-10-CM | POA: Diagnosis present

## 2016-06-01 LAB — TYPE AND SCREEN
ABO/RH(D): O NEG
Antibody Screen: NEGATIVE

## 2016-06-01 LAB — CBC
HCT: 34.9 % — ABNORMAL LOW (ref 35.0–47.0)
Hemoglobin: 11.9 g/dL — ABNORMAL LOW (ref 12.0–16.0)
MCH: 29.9 pg (ref 26.0–34.0)
MCHC: 34.2 g/dL (ref 32.0–36.0)
MCV: 87.3 fL (ref 80.0–100.0)
PLATELETS: 142 10*3/uL — AB (ref 150–440)
RBC: 3.99 MIL/uL (ref 3.80–5.20)
RDW: 14.4 % (ref 11.5–14.5)
WBC: 10.2 10*3/uL (ref 3.6–11.0)

## 2016-06-01 MED ORDER — SOD CITRATE-CITRIC ACID 500-334 MG/5ML PO SOLN
30.0000 mL | ORAL | Status: DC | PRN
  Filled 2016-06-01: qty 30

## 2016-06-01 MED ORDER — ACETAMINOPHEN 325 MG PO TABS: 650.0000 mg | ORAL_TABLET | ORAL | Status: DC | PRN

## 2016-06-01 MED ORDER — OXYTOCIN 40 UNITS IN LACTATED RINGERS INFUSION - SIMPLE MED
1.0000 m[IU]/min | INTRAVENOUS | Status: DC
  Administered 2016-06-01: 1 m[IU]/min via INTRAVENOUS
  Filled 2016-06-01: qty 1000

## 2016-06-01 MED ORDER — LACTATED RINGERS IV SOLN: 500.0000 mL | INTRAVENOUS | Status: DC | PRN

## 2016-06-01 MED ORDER — OXYTOCIN 40 UNITS IN LACTATED RINGERS INFUSION - SIMPLE MED
2.5000 [IU]/h | INTRAVENOUS | Status: DC
  Administered 2016-06-02: 1000 mL via INTRAVENOUS
  Filled 2016-06-01: qty 1000

## 2016-06-01 MED ORDER — OXYTOCIN BOLUS FROM INFUSION: 500.0000 mL | Freq: Once | INTRAVENOUS | Status: DC

## 2016-06-01 MED ORDER — LIDOCAINE HCL (PF) 1 % IJ SOLN
30.0000 mL | INTRAMUSCULAR | Status: DC | PRN
Start: 2016-06-01 — End: 2016-06-02

## 2016-06-01 MED ORDER — MISOPROSTOL 200 MCG PO TABS
ORAL_TABLET | ORAL | Status: AC
  Filled 2016-06-01: qty 4

## 2016-06-01 MED ORDER — AMMONIA AROMATIC IN INHA
RESPIRATORY_TRACT | Status: AC
  Filled 2016-06-01: qty 10

## 2016-06-01 MED ORDER — OXYTOCIN 10 UNIT/ML IJ SOLN
INTRAMUSCULAR | Status: AC
  Filled 2016-06-01: qty 2

## 2016-06-01 MED ORDER — LIDOCAINE HCL (PF) 1 % IJ SOLN
INTRAMUSCULAR | Status: AC
  Filled 2016-06-01: qty 30

## 2016-06-01 MED ORDER — TERBUTALINE SULFATE 1 MG/ML IJ SOLN: 0.2500 mg | Freq: Once | INTRAMUSCULAR | Status: DC | PRN

## 2016-06-01 MED ORDER — LACTATED RINGERS IV SOLN
INTRAVENOUS | Status: DC
  Administered 2016-06-01 – 2016-06-02 (×2): via INTRAVENOUS
  Administered 2016-06-02: 1000 mL via INTRAVENOUS

## 2016-06-01 MED ORDER — ONDANSETRON HCL 4 MG/2ML IJ SOLN: 4.0000 mg | Freq: Four times a day (QID) | INTRAMUSCULAR | Status: DC | PRN

## 2016-06-01 MED ORDER — FENTANYL CITRATE (PF) 100 MCG/2ML IJ SOLN: 50.0000 ug | INTRAMUSCULAR | Status: DC | PRN

## 2016-06-01 NOTE — H&P (Signed)
Obstetric History and Physical  Alison RoughenRachel Parks is a 41 y.o. G2P1001 with IUP at 6965w0d presenting with orders for IOL secondary to LGA infant. Patient states she has been having  irregular, every 10-20 minutes contractions, none vaginal bleeding, intact membranes, with active fetal movement.    [facility]  Pregnancy complications or risks:none  Prenatal labs and studies: ABO, Rh: O/Negative/-- (06/27 16100921) Antibody: Negative (06/27 0921) Rubella: 5.25 (06/27 0921) RPR: Non Reactive (06/27 0921)  HBsAg: Negative (06/27 0921)  HIV: Non Reactive (06/27 0921)  GBS:  1 hr Glucola  normal Genetic screening normal Anatomy US normal  No past medical history on file.  Past Surgical History:  Procedure Laterality Date  . NO PAST SURGERIES      OB History  Gravida Para Term Preterm AB Living  2 1 1     1   SAB TAB Ectopic Multiple Live Births          1    # Outcome Date GA Lbr Len/2nd Weight Sex Delivery Anes PTL Lv  2 Current           1 Term 2007   8 lb 12.8 oz (3.992 kg) M Vag-Spont   LIV      Social History   Social History  . Marital status: Married    Spouse name: N/A  . Number of children: N/A  . Years of education: N/A   Social History Main Topics  . Smoking status: Never Smoker  . Smokeless tobacco: Never Used  . Alcohol use No  . Drug use: No  . Sexual activity: Yes    Birth control/ protection: None   Other Topics Concern  . Not on file   Social History Narrative  . No narrative on file    Family History  Problem Relation Age of Onset  . Diabetes Father   . Congestive Heart Failure Father   . Breast cancer Paternal Grandmother   . Ovarian cancer Neg Hx   . Colon cancer Neg Hx     Prescriptions Prior to Admission  Medication Sig Dispense Refill Last Dose  . ipratropium (ATROVENT) 0.06 % nasal spray Place 2 sprays into both nostrils 4 (four) times daily. (Patient not taking: Reported on 05/31/2016) 15 mL 0 Not Taking  .  lidocaine (XYLOCAINE) 2 % solution Use as directed 20 mLs in the mouth or throat as needed for mouth pain. (Patient not taking: Reported on 05/31/2016) 100 mL 0 Not Taking  . Prenatal Vit-Fe Fumarate-FA (PRENATAL MULTIVITAMIN) TABS tablet Take 1 tablet by mouth daily at 12 noon.   Taking    Allergies  Allergen Reactions  . Amoxicillin Rash    Review of Systems: Negative except for what is mentioned in HPI.  Physical Exam: BP (!) 145/89   Pulse 95   Temp 98 F (36.7 C) (Oral)   Resp 20   Ht 5\' 8"  (1.727 m)   Wt 260 lb (117.9 kg)   LMP 09/02/2015 (Exact Date)   BMI 39.53 kg/m  GENERAL: Well-developed, well-nourished female in no acute distress.  LUNGS: Clear to auscultation bilaterally.  HEART: Regular rate and rhythm. ABDOMEN: Soft, nontender, nondistended, gravid. EXTREMITIES: Nontender, mild non-pitting edema, 2+ distal pulses. Cervical Exam: Dilation: 3 Effacement (%): 50 Station: Ballotable Presentation: Vertex Exam by:: MS CNM FHT:  Baseline rate 145 bpm   Variability moderate  Accelerations present   Decelerations none Contractions: Every 6-10 mins, mild to palpations   Pertinent Labs/Studies:  No results found for this or any previous visit (from the past 24 hour(s)).  Assessment : Alison Parks is a 41 y.o. G2P1001 at [redacted]w[redacted]d being admitted for labor.  Plan: Labor: Expectant management.  Induction/Augmentation as needed, per protocol FWB: Reassuring fetal heart tracing.  GBS negative Delivery plan: Hopeful for vaginal delivery  Bobetta Korf, CNM [facility], [facility]

## 2016-06-02 ENCOUNTER — Encounter
Admission: EM | Disposition: A | Discharge status: Home / Self Care | Payer: Self-pay | Source: Home / Self Care | Attending: Obstetrics and Gynecology

## 2016-06-02 ENCOUNTER — Inpatient Hospital Stay: Payer: BC Managed Care – PPO | Admitting: Anesthesiology

## 2016-06-02 DIAGNOSIS — Z3483 Encounter for supervision of other normal pregnancy, third trimester: Secondary | ICD-10-CM

## 2016-06-02 SURGERY — Surgical Case
Anesthesia: Spinal

## 2016-06-02 MED ORDER — MORPHINE SULFATE (PF) 0.5 MG/ML IJ SOLN
INTRAMUSCULAR | Status: DC | PRN
  Administered 2016-06-02: .2 mg via INTRATHECAL

## 2016-06-02 MED ORDER — NALBUPHINE HCL 10 MG/ML IJ SOLN: 5.0000 mg | Freq: Once | INTRAMUSCULAR | Status: DC | PRN

## 2016-06-02 MED ORDER — NALOXONE HCL 2 MG/2ML IJ SOSY: 1.0000 ug/kg/h | PREFILLED_SYRINGE | INTRAVENOUS | Status: DC | PRN

## 2016-06-02 MED ORDER — BUPIVACAINE HCL (PF) 0.75 % IJ SOLN
INTRAMUSCULAR | Status: DC | PRN
  Administered 2016-06-02: 1.6 mL via INTRATHECAL

## 2016-06-02 MED ORDER — DIPHENHYDRAMINE HCL 25 MG PO CAPS: 25.0000 mg | ORAL_CAPSULE | ORAL | Status: DC | PRN

## 2016-06-02 MED ORDER — GENTAMICIN SULFATE 40 MG/ML IJ SOLN
5.0000 mg/kg | INTRAVENOUS | Status: DC
  Filled 2016-06-02: qty 14.75

## 2016-06-02 MED ORDER — ACETAMINOPHEN 325 MG PO TABS
650.0000 mg | ORAL_TABLET | ORAL | Status: DC | PRN
  Administered 2016-06-03 (×2): 650 mg via ORAL
  Filled 2016-06-02 (×2): qty 2

## 2016-06-02 MED ORDER — DIPHENHYDRAMINE HCL 25 MG PO CAPS: 25.0000 mg | ORAL_CAPSULE | Freq: Four times a day (QID) | ORAL | Status: DC | PRN

## 2016-06-02 MED ORDER — ONDANSETRON HCL 4 MG/2ML IJ SOLN
INTRAMUSCULAR | Status: AC
  Filled 2016-06-02: qty 2

## 2016-06-02 MED ORDER — FENTANYL CITRATE (PF) 100 MCG/2ML IJ SOLN
INTRAMUSCULAR | Status: AC
  Filled 2016-06-02: qty 2

## 2016-06-02 MED ORDER — TETANUS-DIPHTH-ACELL PERTUSSIS 5-2.5-18.5 LF-MCG/0.5 IM SUSP: 0.5000 mL | Freq: Once | INTRAMUSCULAR | Status: DC

## 2016-06-02 MED ORDER — ACETAMINOPHEN 500 MG PO TABS
1000.0000 mg | ORAL_TABLET | Freq: Four times a day (QID) | ORAL | Status: AC
  Administered 2016-06-02 – 2016-06-03 (×2): 1000 mg via ORAL
  Filled 2016-06-02 (×2): qty 2

## 2016-06-02 MED ORDER — NALBUPHINE HCL 10 MG/ML IJ SOLN: 5.0000 mg | INTRAMUSCULAR | Status: DC | PRN

## 2016-06-02 MED ORDER — KETOROLAC TROMETHAMINE 30 MG/ML IJ SOLN
30.0000 mg | Freq: Four times a day (QID) | INTRAMUSCULAR | Status: AC | PRN
  Administered 2016-06-02: 30 mg via INTRAVENOUS
  Filled 2016-06-02: qty 1

## 2016-06-02 MED ORDER — KETOROLAC TROMETHAMINE 30 MG/ML IJ SOLN: 30.0000 mg | Freq: Four times a day (QID) | INTRAMUSCULAR | Status: AC | PRN

## 2016-06-02 MED ORDER — ONDANSETRON HCL 4 MG/2ML IJ SOLN: 4.0000 mg | Freq: Once | INTRAMUSCULAR | Status: DC | PRN

## 2016-06-02 MED ORDER — SIMETHICONE 80 MG PO CHEW
80.0000 mg | CHEWABLE_TABLET | ORAL | Status: DC
  Administered 2016-06-03: 80 mg via ORAL
  Filled 2016-06-02: qty 1

## 2016-06-02 MED ORDER — MORPHINE SULFATE (PF) 0.5 MG/ML IJ SOLN
INTRAMUSCULAR | Status: AC
  Filled 2016-06-02: qty 10

## 2016-06-02 MED ORDER — FENTANYL CITRATE (PF) 100 MCG/2ML IJ SOLN: 25.0000 ug | INTRAMUSCULAR | Status: DC | PRN

## 2016-06-02 MED ORDER — NALOXONE HCL 0.4 MG/ML IJ SOLN: 0.4000 mg | INTRAMUSCULAR | Status: DC | PRN

## 2016-06-02 MED ORDER — DIPHENHYDRAMINE HCL 50 MG/ML IJ SOLN: 12.5000 mg | INTRAMUSCULAR | Status: DC | PRN

## 2016-06-02 MED ORDER — EPHEDRINE SULFATE 50 MG/ML IJ SOLN
INTRAMUSCULAR | Status: DC | PRN
  Administered 2016-06-02: 15 mg via INTRAVENOUS
  Administered 2016-06-02: 10 mg via INTRAVENOUS
  Administered 2016-06-02: 15 mg via INTRAVENOUS
  Administered 2016-06-02: 10 mg via INTRAVENOUS

## 2016-06-02 MED ORDER — PHENYLEPHRINE HCL 10 MG/ML IJ SOLN
INTRAMUSCULAR | Status: DC | PRN
  Administered 2016-06-02: 50 ug via INTRAVENOUS

## 2016-06-02 MED ORDER — LACTATED RINGERS IV SOLN: INTRAVENOUS | Status: DC

## 2016-06-02 MED ORDER — SIMETHICONE 80 MG PO CHEW
80.0000 mg | CHEWABLE_TABLET | Freq: Three times a day (TID) | ORAL | Status: DC
  Administered 2016-06-03 – 2016-06-04 (×4): 80 mg via ORAL
  Filled 2016-06-02 (×4): qty 1

## 2016-06-02 MED ORDER — FENTANYL CITRATE (PF) 100 MCG/2ML IJ SOLN
INTRAMUSCULAR | Status: DC | PRN
Start: 2016-06-02 — End: 2016-06-02
  Administered 2016-06-02: 20 ug via INTRATHECAL

## 2016-06-02 MED ORDER — COCONUT OIL OIL: 1.0000 "application " | TOPICAL_OIL | Status: DC | PRN

## 2016-06-02 MED ORDER — SODIUM CHLORIDE 0.9% FLUSH: 3.0000 mL | INTRAVENOUS | Status: DC | PRN

## 2016-06-02 MED ORDER — ONDANSETRON HCL 4 MG/2ML IJ SOLN: 4.0000 mg | Freq: Three times a day (TID) | INTRAMUSCULAR | Status: DC | PRN

## 2016-06-02 MED ORDER — CLINDAMYCIN PHOSPHATE 900 MG/50ML IV SOLN: INTRAVENOUS | Status: DC | PRN

## 2016-06-02 MED ORDER — MEPERIDINE HCL 25 MG/ML IJ SOLN
6.2500 mg | INTRAMUSCULAR | Status: DC | PRN
Start: 2016-06-02 — End: 2016-06-04

## 2016-06-02 MED ORDER — IBUPROFEN 800 MG PO TABS
800.0000 mg | ORAL_TABLET | Freq: Three times a day (TID) | ORAL | Status: DC
  Administered 2016-06-03 – 2016-06-04 (×4): 800 mg via ORAL
  Filled 2016-06-02 (×4): qty 1

## 2016-06-02 MED ORDER — LACTATED RINGERS IV SOLN
INTRAVENOUS | Status: DC | PRN
  Administered 2016-06-02: 20:00:00 via INTRAVENOUS

## 2016-06-02 MED ORDER — SIMETHICONE 80 MG PO CHEW: 80.0000 mg | CHEWABLE_TABLET | ORAL | Status: DC | PRN

## 2016-06-02 MED ORDER — SENNOSIDES-DOCUSATE SODIUM 8.6-50 MG PO TABS
2.0000 | ORAL_TABLET | ORAL | Status: DC
  Administered 2016-06-03: 2 via ORAL
  Filled 2016-06-02: qty 2

## 2016-06-02 MED ORDER — SOD CITRATE-CITRIC ACID 500-334 MG/5ML PO SOLN
30.0000 mL | ORAL | Status: AC
  Administered 2016-06-02: 30 mL via ORAL

## 2016-06-02 MED ORDER — MEASLES, MUMPS & RUBELLA VAC ~~LOC~~ INJ: 0.5000 mL | INJECTION | Freq: Once | SUBCUTANEOUS | Status: DC

## 2016-06-02 MED ORDER — MENTHOL 3 MG MT LOZG
1.0000 | LOZENGE | OROMUCOSAL | Status: DC | PRN
  Filled 2016-06-02: qty 9

## 2016-06-02 MED ORDER — CLINDAMYCIN PHOSPHATE 900 MG/50ML IV SOLN
900.0000 mg | INTRAVENOUS | Status: AC
Start: 2016-06-02 — End: 2016-06-02
  Administered 2016-06-02: 900 mg via INTRAVENOUS
  Filled 2016-06-02: qty 50

## 2016-06-02 MED ORDER — OXYTOCIN 40 UNITS IN LACTATED RINGERS INFUSION - SIMPLE MED
2.5000 [IU]/h | INTRAVENOUS | Status: AC
  Administered 2016-06-03: 2.5 [IU]/h via INTRAVENOUS
  Filled 2016-06-02: qty 1000

## 2016-06-02 MED ORDER — PRENATAL MULTIVITAMIN CH
1.0000 | ORAL_TABLET | Freq: Every day | ORAL | Status: DC
  Administered 2016-06-03: 1 via ORAL
  Filled 2016-06-02: qty 1

## 2016-06-02 SURGICAL SUPPLY — 30 items
ADHESIVE MASTISOL STRL (MISCELLANEOUS) IMPLANT
BAG COUNTER SPONGE EZ (MISCELLANEOUS) ×4 IMPLANT
BENZOIN TINCTURE PRP APPL 2/3 (GAUZE/BANDAGES/DRESSINGS) ×3 IMPLANT
CANISTER SUCT 3000ML (MISCELLANEOUS) ×3 IMPLANT
CELL SAVER LIPIGURD (MISCELLANEOUS) ×1 IMPLANT
CHLORAPREP W/TINT 26ML (MISCELLANEOUS) ×6 IMPLANT
CLOSURE WOUND 1/2 X4 (GAUZE/BANDAGES/DRESSINGS) ×1
COUNTER SPONGE BAG EZ (MISCELLANEOUS) ×2
DRSG TELFA 3X8 NADH (GAUZE/BANDAGES/DRESSINGS) ×3 IMPLANT
ELECT CAUTERY BLADE 6.4 (BLADE) ×3 IMPLANT
ELECT COAG BIPOLAR CYL 1.2MMM (ELECTROSURGICAL) ×3
ELECTRODE COAG BIPLR CYL 1.2MM (ELECTROSURGICAL) ×1 IMPLANT
EXTRT SYSTEM ALEXIS 14CM (MISCELLANEOUS) ×3
GAUZE SPONGE 4X4 12PLY STRL (GAUZE/BANDAGES/DRESSINGS) ×3 IMPLANT
GLOVE INDICATOR 7.0 STRL GRN (GLOVE) ×9 IMPLANT
GLOVE ORTHO TXT STRL SZ7.5 (GLOVE) IMPLANT
GLOVE PROTEXIS LATEX SZ 7.5 (GLOVE) ×9 IMPLANT
GOWN STRL REUS W/ TWL LRG LVL3 (GOWN DISPOSABLE) ×3 IMPLANT
GOWN STRL REUS W/TWL LRG LVL3 (GOWN DISPOSABLE) ×6
KIT RM TURNOVER STRD PROC AR (KITS) ×3 IMPLANT
NS IRRIG 1000ML POUR BTL (IV SOLUTION) ×3 IMPLANT
PACK C SECTION AR (MISCELLANEOUS) ×3 IMPLANT
PAD OB MATERNITY 4.3X12.25 (PERSONAL CARE ITEMS) ×3 IMPLANT
PAD PREP 24X41 OB/GYN DISP (PERSONAL CARE ITEMS) ×3 IMPLANT
SPONGE LAP 18X18 5 PK (GAUZE/BANDAGES/DRESSINGS) IMPLANT
STRIP CLOSURE SKIN 1/2X4 (GAUZE/BANDAGES/DRESSINGS) ×2 IMPLANT
SUT VIC AB 0 CTX 36 (SUTURE) ×4
SUT VIC AB 0 CTX36XBRD ANBCTRL (SUTURE) ×2 IMPLANT
SUT VIC AB 1 CT1 36 (SUTURE) ×3 IMPLANT
SUT VICRYL+ 3-0 36IN CT-1 (SUTURE) ×6 IMPLANT

## 2016-06-02 NOTE — Progress Notes (Signed)
Alison RoughenRachel Riccardi is a 41 y.o. G2P1001 at 245w1d by LMP admitted for induction of labor due to Elective at term and LGA.  Subjective: Reports moderate pain with contractions when lying down  Objective: BP (!) 104/56   Pulse 65   Temp 98 F (36.7 C) (Oral)   Resp 18   Ht 5\' 8"  (1.727 m)   Wt 260 lb (117.9 kg)   LMP 09/02/2015 (Exact Date)   BMI 39.53 kg/m  I/O last 3 completed shifts: In: 1350.5 [I.V.:1350.5] Out: -  No intake/output data recorded.  FHT:  FHR: 140 bpm, variability: moderate,  accelerations:  Present,  decelerations:  Absent UC:   regular, every 1-2 minutes, moderate to palpation, on 26 mu/min pitocin SVE:   Dilation: 4.5 Effacement (%): 60 Station: Ballotable Exam by:: shambley  Labs: Lab Results  Component Value Date   WBC 10.2 06/01/2016   HGB 11.9 (L) 06/01/2016   HCT 34.9 (L) 06/01/2016   MCV 87.3 06/01/2016   PLT 142 (L) 06/01/2016    Assessment / Plan: Arrest of decent  Labor: not progressing Preeclampsia:  labs stable Fetal Wellbeing:  Category I Pain Control:  Labor support without medications I/D:  n/a Anticipated MOD:  ceserean delivery discussed in detail with patient and spouse, due to lack of fetal decent and suspected LGA infant. will consider and make a decision, and continue with pitocin at this time.  Melody NIKE Shambley, CNM 06/02/2016, 5:51 PM

## 2016-06-02 NOTE — Anesthesia Preprocedure Evaluation (Signed)
Anesthesia Evaluation  Patient identified by MRN, date of birth, ID band Patient awake    Reviewed: Allergy & Precautions, H&P , NPO status , Patient's Chart, lab work & pertinent test results, reviewed documented beta blocker date and time   Airway Mallampati: III  TM Distance: >3 FB Neck ROM: full    Dental no notable dental hx. (+) Poor Dentition   Pulmonary neg pulmonary ROS, Current Smoker,    Pulmonary exam normal breath sounds clear to auscultation       Cardiovascular Exercise Tolerance: Good negative cardio ROS Normal cardiovascular exam Rhythm:regular Rate:Normal     Neuro/Psych negative neurological ROS  negative psych ROS   GI/Hepatic negative GI ROS, Neg liver ROS,   Endo/Other  negative endocrine ROSdiabetes  Renal/GU negative Renal ROS  negative genitourinary   Musculoskeletal   Abdominal   Peds  Hematology negative hematology ROS (+)   Anesthesia Other Findings History reviewed. No pertinent past medical history. Past Surgical History: No date: NO PAST SURGERIES BMI    Body Mass Index:  39.53 kg/m     Reproductive/Obstetrics negative OB ROS (+) Pregnancy                             Anesthesia Physical Anesthesia Plan  ASA: II and emergent  Anesthesia Plan: General   Post-op Pain Management:    Induction:   Airway Management Planned:   Additional Equipment:   Intra-op Plan:   Post-operative Plan:   Informed Consent: I have reviewed the patients History and Physical, chart, labs and discussed the procedure including the risks, benefits and alternatives for the proposed anesthesia with the patient or authorized representative who has indicated his/her understanding and acceptance.   Dental Advisory Given  Plan Discussed with: CRNA  Anesthesia Plan Comments:         Anesthesia Quick Evaluation

## 2016-06-02 NOTE — Anesthesia Post-op Follow-up Note (Cosign Needed)
Anesthesia QCDR form completed.        

## 2016-06-02 NOTE — Transfer of Care (Signed)
Immediate Anesthesia Transfer of Care Note  Patient: [name]  Procedure(s) Performed: Procedure(s): CESAREAN SECTION (N/A)  Patient Location: PACU  Anesthesia Type:Spinal  Level of Consciousness: awake, alert , oriented and patient cooperative  Airway & Oxygen Therapy: Patient Spontanous Breathing and Patient connected to nasal cannula oxygen  Post-op Assessment: Report given to RN and Post -op Vital signs reviewed and stable  Post vital signs: Reviewed and stable  Last Vitals:  Vitals:   06/02/16 2118 06/02/16 2120  BP: 105/67   Pulse: 94 90  Resp: 18 18  Temp:      Last Pain:  Vitals:   06/02/16 2118  TempSrc: Tympanic  PainSc: 0-No pain         Complications: No apparent anesthesia complications

## 2016-06-02 NOTE — Progress Notes (Signed)
Cory RoughenRachel Pineau is a 41 y.o. G2P1001 at 2348w1d by LMP admitted for induction of labor due to LGA.  Subjective: Denies any pain with contractions, just mild cramping.  Objective: BP (!) 104/56   Pulse 65   Temp 97.7 F (36.5 C) (Oral)   Resp 18   Ht 5\' 8"  (1.727 m)   Wt 260 lb (117.9 kg)   LMP 09/02/2015 (Exact Date)   BMI 39.53 kg/m  I/O last 3 completed shifts: In: 1350.5 [I.V.:1350.5] Out: -  No intake/output data recorded.  FHT:  FHR: 138 bpm, variability: moderate,  accelerations:  Present,  decelerations:  Absent UC:   irregular, every 2-4 minutes, mild to palpation, on 7011mu/min pitocin SVE:   Dilation: 4 Effacement (%): 60 Station: Ballotable Exam by:: Timoteo Carreiro  Labs: Lab Results  Component Value Date   WBC 10.2 06/01/2016   HGB 11.9 (L) 06/01/2016   HCT 34.9 (L) 06/01/2016   MCV 87.3 06/01/2016   PLT 142 (L) 06/01/2016    Assessment / Plan: Induction of labor due to term with favorable cervix and LGA,  progressing well on pitocin  Labor: Progressing on Pitocin, will continue to increase then AROM Preeclampsia:  labs stable Fetal Wellbeing:  Category I Pain Control:  Labor support without medications I/D:  n/a Anticipated MOD:  NSVD  Bowen Kia N Toluwani Yadav 06/02/2016, 7:59 AM

## 2016-06-02 NOTE — Progress Notes (Signed)
Discussed dx of macrosomia and failure to dilate with patient.  Rationale for CD discussed.  R/B of surgery reviewed and discussed.  Post op care discussed. All questions answered.

## 2016-06-02 NOTE — Anesthesia Procedure Notes (Signed)
Spinal  Patient location during procedure: OR Staffing Performed: anesthesiologist  Preanesthetic Checklist Completed: patient identified, site marked, surgical consent, pre-op evaluation, timeout performed, IV checked, risks and benefits discussed and monitors and equipment checked Spinal Block Patient position: sitting Prep: Betadine Patient monitoring: heart rate, continuous pulse ox, blood pressure and cardiac monitor Approach: midline Location: L4-5 Injection technique: single-shot Needle Needle type: Whitacre and Introducer  Needle gauge: 24 G Needle length: 9 cm Assessment Sensory level: T4 Additional Notes Negative paresthesia. Negative blood return. Positive free-flowing CSF. Expiration date of kit checked and confirmed. Patient tolerated procedure well, without complications.       

## 2016-06-02 NOTE — Progress Notes (Signed)
Alison RoughenRachel Parks is a 41 y.o. G2P1001 at 2939w1d by LMP admitted for induction of labor due to Elective at term and LGA infant.  Subjective:  Rates contractions a 3 on pain scale 0-10 Objective: BP (!) 104/56   Pulse 65   Temp 97.7 F (36.5 C) (Oral)   Resp 18   Ht 5\' 8"  (1.727 m)   Wt 260 lb (117.9 kg)   LMP 09/02/2015 (Exact Date)   BMI 39.53 kg/m  I/O last 3 completed shifts: In: 1350.5 [I.V.:1350.5] Out: -  No intake/output data recorded.  FHT:  FHR: 133 bpm, variability: moderate,  accelerations:  Present,  decelerations:  Absent UC:   regular, every 2-3 minutes, moderate to palpation, on 5819mu/min pitocin SVE:   Dilation: 4.5 Effacement (%): 60 Station: Ballotable Exam by:: Alison Parks  Labs: Lab Results  Component Value Date   WBC 10.2 06/01/2016   HGB 11.9 (L) 06/01/2016   HCT 34.9 (L) 06/01/2016   MCV 87.3 06/01/2016   PLT 142 (L) 06/01/2016    Assessment / Plan: Induction of labor due to LGA,  progressing well on pitocin  Labor: Progressing on Pitocin, will continue to increase then AROM Preeclampsia:  labs stable Fetal Wellbeing:  Category I Pain Control:  Labor support without medications I/D:  n/a Anticipated MOD:  NSVD  Alison Parks 06/02/2016, 12:50 PM

## 2016-06-02 NOTE — Op Note (Signed)
OP NOTE  Date @EDTODAYDATE @ @TIMESTAMP @ Name Cory RoughenRachel Valera MR# [id number]  Preoperative Diagnosis: 1. Intrauterine pregnancy at 2333w1d Active Problems:   Labor and delivery, indication for care  2.  failed induction and failure to progress: arrest of dilation  Postoperative Diagnosis: 1. Intrauterine pregnancy at 4333w1d, delivered 2. Viable infant 3. Remainder same as pre-op   Procedure: 1. Primary Low-Transverse Cesarean Section   Surgeon: Elonda Huskyavid J. Ekam Bonebrake, MD  Assistant:: Melody Kingston MinesShambley, CNM  Anesthesia: spinal   EBL: 700 mL  Findings: 1. Female infant in the cephalic presentation, with weight 8# 7 oz, Apgars of 9 at 1 minute and 9 at 5 minutes.  2. Normal uterus, tubes and ovaries.    Procedure:   The patient was prepped and draped in the supine position and placed under spinal anesthesia.  A transverse incision was made across the abdomen in a Pfannenstiel manner. If indicated the old scar was systematically removed with sharp dissection.  We carried the dissection down to the level of the fascia.  The fascia was incised in a curvilinear manner.  The fascia was then elevated from the rectus muscles with blunt and sharp dissection.  The rectus muscles were separated laterally exposing the peritoneum.  The peritoneum was carefully entered with care being taken to avoid bowel and bladder.  A self-retaining retractor was placed.  The visceral peritoneum was incised in a curvilinear fashion across the lower uterine segment creating a bladder flap. A transverse incision was made across the lower uterine segment and extended laterally and superiorly using the bandage scissors.  Artificial rupture membranes was performed and Clear fluid was noted.  The infant was delivered from the cephalic position.  A nuchal cord was found and reduced.  The infant was immediately bulb suctioned.  The cord was doubly clamped and cut. Cord blood was obtained.  The infant was handed to the pediatrician  who then placed the infant under heat lamps where it was cleaned dried and re-suctioned. The placenta was delivered. The hysterotomy incision was then identified on ring forceps.  The uterine cavity was cleaned with a moist lap sponge.  The hysterotomy incision was closed with a running interlocking suture of Vicryl.  Hemostasis was excellent.  Pitocin was run in the IV and the uterus was found to be firm. The posterior cul-de-sac and gutters were cleaned and inspected.  Hemostasis was noted.  The fascia was then closed with a running suture of #1 Vicryl.  Hemostasis of the subcutaneous tissues was obtained using the Bovie.  The subcutaneous tissues were closed with a running suture of 000 Vicryl.  A subcuticular suture was placed.  Steri-Strips were applied in the usual manner.  A pressure dressing was placed.  The patient went to the recovery room in stable condition.   Elonda Huskyavid J. Xayvion Shirah, M.D. 06/02/2016 9:08 PM

## 2016-06-03 ENCOUNTER — Encounter: Payer: Self-pay | Admitting: Obstetrics and Gynecology

## 2016-06-03 LAB — CBC
HCT: 31.8 % — ABNORMAL LOW (ref 35.0–47.0)
HEMOGLOBIN: 11 g/dL — AB (ref 12.0–16.0)
MCH: 30 pg (ref 26.0–34.0)
MCHC: 34.5 g/dL (ref 32.0–36.0)
MCV: 87.1 fL (ref 80.0–100.0)
Platelets: 107 10*3/uL — ABNORMAL LOW (ref 150–440)
RBC: 3.65 MIL/uL — AB (ref 3.80–5.20)
RDW: 14.1 % (ref 11.5–14.5)
WBC: 11.5 10*3/uL — AB (ref 3.6–11.0)

## 2016-06-03 LAB — RPR: RPR: NONREACTIVE

## 2016-06-03 LAB — FETAL SCREEN: Fetal Screen: NEGATIVE

## 2016-06-03 MED ORDER — RHO D IMMUNE GLOBULIN 1500 UNIT/2ML IJ SOSY
300.0000 ug | PREFILLED_SYRINGE | Freq: Once | INTRAMUSCULAR | Status: AC
  Administered 2016-06-03: 300 ug via INTRAVENOUS
  Filled 2016-06-03: qty 2

## 2016-06-03 NOTE — Progress Notes (Signed)
Progress Note - Cesarean Delivery  Cory RoughenRachel Fok is a 41 y.o. G2P2002 now PP day 1 s/p C-Section, Low Transverse .   Subjective:  Patient reports no problems with eating, bowel movements, voiding, or their wound. Pain controlled.  Objective:  Vital signs in last 24 hours: Temp:  [96.8 F (36 C)-98 F (36.7 C)] 97.5 F (36.4 C) (01/26 0440) Pulse Rate:  [76-102] 87 (01/26 0440) Resp:  [10-25] 18 (01/26 0440) BP: (104-137)/(61-83) 129/77 (01/26 0440) SpO2:  [93 %-100 %] 98 % (01/26 0440)  Physical Exam:  General: alert and no distress Lochia: appropriate Uterine Fundus: firm Incision: dressed DVT Evaluation: No evidence of DVT seen on physical exam.    Data Review  Recent Labs  06/01/16 2004 06/03/16 0527  HGB 11.9* 11.0*  HCT 34.9* 31.8*    Assessment:  Active Problems:   Labor and delivery, indication for care   Delivery by cesarean section of full-term infant   Status post Cesarean section. Doing well postoperatively.     Plan:       Continue current care.  OOB - PO pain meds - shower  Elonda Huskyavid J. Zyanne Schumm, M.D. 06/03/2016 7:51 AM

## 2016-06-03 NOTE — Telephone Encounter (Signed)
Left message for pt to return call.

## 2016-06-03 NOTE — Anesthesia Postprocedure Evaluation (Signed)
Anesthesia Post Note  Patient: Alison Parks  Procedure(s) Performed: Procedure(s) (LRB): CESAREAN SECTION (N/A)  Patient location during evaluation: Mother Baby Anesthesia Type: Spinal Level of consciousness: awake, awake and alert and oriented Pain management: pain level controlled Vital Signs Assessment: post-procedure vital signs reviewed and stable Respiratory status: spontaneous breathing, nonlabored ventilation and respiratory function stable Cardiovascular status: blood pressure returned to baseline and stable Postop Assessment: no headache and no backache Anesthetic complications: no     Last Vitals:  Vitals:   06/03/16 0211 06/03/16 0440  BP: 104/69 129/77  Pulse: 88 87  Resp: 18 18  Temp: 36.4 C 36.4 C    Last Pain:  Vitals:   06/03/16 0211  TempSrc: Oral  PainSc:                  Lyn RecordsNoles,  Amahia Madonia R

## 2016-06-03 NOTE — Progress Notes (Signed)
Asked pt if she received Rhogam during pregnancy.  Pt does not recall getting Rhogam during pregnancy.  [facility] called at 1000 to verify this information.  Per [facility] staff pt did not receive  Rhogam during pregnancy. Dr.Evans notified about this at 1015.  Pt to receive Rhogam tomorrow 06/04/16 after fetal screen is complete.

## 2016-06-03 NOTE — Anesthesia Post-op Follow-up Note (Signed)
  Anesthesia Pain Follow-up Note  Patient: Alison RoughenRachel Parks  Day #: 1  Date of Follow-up: 06/03/2016 Time: 7:33 AM  Last Vitals:  Vitals:   06/03/16 0211 06/03/16 0440  BP: 104/69 129/77  Pulse: 88 87  Resp: 18 18  Temp: 36.4 C 36.4 C    Level of Consciousness: alert  Pain: none   Side Effects:None  Catheter Site Exam:clean, dry, no drainage     Plan: Continue current therapy of postop epidural at surgeon's request  Tyronza Happe,  Sheran FavaMark R

## 2016-06-03 NOTE — Progress Notes (Signed)
Subjective: Post Op Day 1 Day Post-Op: Cesarean Delivery Patient reports tolerating PO and Breastfeeding.    Objective: Vital signs in last 24 hours: Temp:  [96.8 F (36 C)-97.6 F (36.4 C)] 97.6 F (36.4 C) (01/26 1200) Pulse Rate:  [76-102] 81 (01/26 1200) Resp:  [10-25] 16 (01/26 1200) BP: (104-137)/(67-83) 132/80 (01/26 1200) SpO2:  [93 %-100 %] 98 % (01/26 1200)  Physical Exam:  General: alert, cooperative, appears stated age and morbidly obese Lochia: appropriate Uterine Fundus: firm Incision: healing well, no significant drainage DVT Evaluation: No evidence of DVT seen on physical exam. Negative Homan's sign. Calf/Ankle edema is present.   Recent Labs  06/01/16 2004 06/03/16 0527  HGB 11.9* 11.0*  HCT 34.9* 31.8*     Assessment/Plan: Status post Cesarean section. Doing well postoperatively.  Continue current care.  Alison Parks N Lucely Leard 06/03/2016, 1:41 PM

## 2016-06-04 LAB — RHOGAM INJECTION: Unit division: 0

## 2016-06-04 MED ORDER — NORETHINDRONE 0.35 MG PO TABS: 1.0000 | ORAL_TABLET | Freq: Every day | ORAL | 11 refills | Status: AC

## 2016-06-04 MED ORDER — FUSION PLUS PO CAPS: 1.0000 | ORAL_CAPSULE | Freq: Every day | ORAL | 1 refills | Status: DC

## 2016-06-04 MED ORDER — VITAMIN D3 125 MCG (5000 UT) PO CAPS: 1.0000 | ORAL_CAPSULE | Freq: Every day | ORAL | 2 refills | Status: DC

## 2016-06-04 MED ORDER — IBUPROFEN 800 MG PO TABS: 800.0000 mg | ORAL_TABLET | Freq: Three times a day (TID) | ORAL | 0 refills | Status: DC

## 2016-06-04 MED ORDER — HYDROCODONE-ACETAMINOPHEN 5-325 MG PO TABS: 1.0000 | ORAL_TABLET | Freq: Four times a day (QID) | ORAL | 0 refills | Status: DC | PRN

## 2016-06-04 NOTE — Progress Notes (Signed)
Discharge instructions complete and prescriptions given. Patient verbalizes understanding of teaching. Patient discharged home at 1130. 

## 2016-06-04 NOTE — Discharge Summary (Signed)
OB Discharge Summary     Patient Name: Alison RoughenRachel Shoaff DOB: [birth year] MRN: [id number]  Date of admission: 06/01/2016 Delivering MD: Linzie CollinEVANS, DAVID JAMES   Date of discharge: 06/04/2016  Admitting diagnosis: 39wks, induction c-section Intrauterine pregnancy: 6611w1d     Secondary diagnosis:  Active Problems:   Labor and delivery, indication for care   Delivery by cesarean section of full-term infant  Additional problems: CPD     Discharge diagnosis: Term Pregnancy Delivered                                                                                                Post partum procedures:rhogam  Augmentation: Pitocin and Cytotec  Complications: None  Hospital course:  Onset of Labor With Unplanned C/S  41 y.o. yo G2P2002 at 1811w1d was admitted in Active Labor on 06/01/2016. Patient had a labor course significant for FTP secondary to CPD. Membrane Rupture Time/Date:   ,06/02/2016   The patient went for cesarean section due to Arrest of Dilation and Arrest of Descent, and delivered a Viable infant,06/02/2016  Details of operation can be found in separate operative note. Patient had an uncomplicated postpartum course.  She is ambulating,tolerating a regular diet, passing flatus, and urinating well.  Patient is discharged home in stable condition 06/04/16.  Physical exam  Vitals:   06/03/16 1941 06/04/16 0013 06/04/16 0343 06/04/16 0724  BP: 125/73   134/73  Pulse: 84   79  Resp: 19   18  Temp: 98.2 F (36.8 C) 97.7 F (36.5 C) 97.9 F (36.6 C) 98.4 F (36.9 C)  TempSrc: Oral Oral Oral Oral  SpO2: 99%   97%  Weight:      Height:       General: alert, cooperative and no distress Lochia: appropriate Uterine Fundus: firm Incision: Healing well with no significant drainage, No significant erythema DVT Evaluation: No evidence of DVT seen on physical exam. Negative Homan's sign. Calf/Ankle edema is present Labs: Lab Results  Component Value Date   WBC 11.5 (H) 06/03/2016   HGB 11.0 (L) 06/03/2016   HCT 31.8 (L) 06/03/2016   MCV 87.1 06/03/2016   PLT 107 (L) 06/03/2016   No flowsheet data found.  Discharge instruction: per After Visit Summary and "Baby and Me Booklet".  After visit meds:  Allergies as of 06/04/2016      Reactions   Amoxicillin Rash      Medication List    STOP taking these medications   ipratropium 0.06 % nasal spray Commonly known as:  ATROVENT   lidocaine 2 % solution Commonly known as:  XYLOCAINE     TAKE these medications   FUSION PLUS Caps Take 1 capsule by mouth daily.   HYDROcodone-acetaminophen 5-325 MG tablet Commonly known as:  NORCO/VICODIN Take 1 tablet by mouth every 6 (six) hours as needed.   ibuprofen 800 MG tablet Commonly known as:  ADVIL,MOTRIN Take 1 tablet (800 mg total) by mouth every 8 (eight) hours.   norethindrone 0.35 MG tablet Commonly known as:  MICRONOR,CAMILA,ERRIN Take 1 tablet (0.35 mg total) by mouth daily.   prenatal multivitamin  Tabs tablet Take 1 tablet by mouth daily at 12 noon.   Vitamin D3 5000 units Caps Take 1 capsule (5,000 Units total) by mouth daily.       Diet: routine diet  Activity: Advance as tolerated. Pelvic rest for 6 weeks.   Outpatient follow up:1 and 6 weeks as directed Follow up Appt:Future Appointments Date Time Provider Department Center  07/13/2016 10:45 AM Melody Suzan Nailer, CNM [facility] None   Follow up Visit:No Follow-up on file.  Postpartum contraception: Progesterone only pills  Newborn Data: Live born female "Ivin Booty", circumcision done yesterday Birth Weight: 8 lb 7.1 oz (3830 g) APGAR: 9, 9  Baby Feeding: Breast Disposition:home with mother   06/04/2016 Purcell Nails, CNM

## 2016-06-09 ENCOUNTER — Encounter: Payer: Self-pay | Admitting: Obstetrics and Gynecology

## 2016-06-09 ENCOUNTER — Ambulatory Visit (INDEPENDENT_AMBULATORY_CARE_PROVIDER_SITE_OTHER): Payer: BC Managed Care – PPO | Admitting: Obstetrics and Gynecology

## 2016-06-09 VITALS — BP 148/84 | HR 98 | Ht 68.0 in | Wt 242.6 lb

## 2016-06-09 DIAGNOSIS — Z98891 History of uterine scar from previous surgery: Secondary | ICD-10-CM

## 2016-06-09 NOTE — Progress Notes (Signed)
   Subjective:     Cory RoughenRachel Cherney is a 41 y.o. female who presents to the clinic 1 weeks status post LTCS  for FTP. Eating a regular diet without difficulty. Bowel movements are normal. Pain is controlled with current analgesics. Medications being used: ibuprofen (OTC) and narcotic analgesics including Percocet.  The following portions of the patient's history were reviewed and updated as appropriate: allergies, current medications, past family history, past medical history, past social history, past surgical history and problem list.  Review of Systems A comprehensive review of systems was negative.    Objective:    BP (!) 148/84   Pulse 98   Ht 5\' 8"  (1.727 m)   Wt 242 lb 9.6 oz (110 kg)   Breastfeeding? Yes   BMI 36.89 kg/m  General:  alert, cooperative and appears stated age  Abdomen: soft, bowel sounds active, non-tender  Incision:   healing well, no drainage, no erythema, no hernia, no seroma, no swelling, no dehiscence, incision well approximated     Assessment:    Doing well postoperatively. Operative findings again reviewed. Pathology report discussed.    Plan:    1. Continue any current medications. 2. Wound care discussed. 3. Activity restrictions: continue all 4. Anticipated return to work: not applicable. 5. Follow up: 5 weeks for PPV.

## 2016-07-13 ENCOUNTER — Encounter: Payer: Self-pay | Admitting: Obstetrics and Gynecology

## 2016-07-13 ENCOUNTER — Ambulatory Visit (INDEPENDENT_AMBULATORY_CARE_PROVIDER_SITE_OTHER): Payer: BC Managed Care – PPO | Admitting: Obstetrics and Gynecology

## 2016-07-13 DIAGNOSIS — E669 Obesity, unspecified: Secondary | ICD-10-CM

## 2016-07-13 NOTE — Progress Notes (Signed)
  Subjective:     Alison Parks is a 41 y.o. female who presents for a postpartum visit. She is 6 weeks postpartum following a low cervical transverse Cesarean section. I have fully reviewed the prenatal and intrapartum course. The delivery was at 39 gestational weeks. Outcome: repeat cesarean section, low transverse incision. Anesthesia: epidural. Postpartum course has been uncomplicated. Baby's course has been uncomplicated. Baby is feeding by breast. Bleeding no bleeding. Bowel function is normal. Bladder function is normal. Patient is not sexually active. Contraception method is abstinence and oral progesterone-only contraceptive. Postpartum depression screening: negative.  The following portions of the patient's history were reviewed and updated as appropriate: allergies, current medications, past family history, past medical history, past social history, past surgical history and problem list.  Review of Systems A comprehensive review of systems was negative.   Objective:    BP 140/84   Pulse 76   Ht 5\' 8"  (1.727 m)   Wt 230 lb 3.2 oz (104.4 kg)   Breastfeeding? Yes   BMI 35.00 kg/m   General:  alert, cooperative, appears stated age and mildly obese   Breasts:  inspection negative, no nipple discharge or bleeding, no masses or nodularity palpable  Lungs: clear to auscultation bilaterally  Heart:  regular rate and rhythm, S1, S2 normal, no murmur, click, rub or gallop  Abdomen: soft, non-tender; bowel sounds normal; no masses,  no organomegaly   Vulva:  normal  Vagina: normal vagina, no discharge, exudate, lesion, or erythema  Cervix:  multiparous appearance  Corpus: normal size, contour, position, consistency, mobility, non-tender  Adnexa:  no mass, fullness, tenderness  Rectal Exam: Not performed.        Assessment:     6 weeks postpartum exam. Pap smear not done at today's visit.   Plan:    1. Contraception: oral progesterone-only contraceptive 2. obesity 3. Follow up  in: 4 months or as needed.

## 2016-07-13 NOTE — Patient Instructions (Signed)
  Place postpartum visit patient instructions here.  

## 2016-07-14 LAB — FERRITIN: FERRITIN: 39 ng/mL (ref 15–150)

## 2016-07-14 LAB — CBC
HEMOGLOBIN: 13.6 g/dL (ref 11.1–15.9)
Hematocrit: 41.8 % (ref 34.0–46.6)
MCH: 28.4 pg (ref 26.6–33.0)
MCHC: 32.5 g/dL (ref 31.5–35.7)
MCV: 87 fL (ref 79–97)
Platelets: 202 10*3/uL (ref 150–379)
RBC: 4.79 x10E6/uL (ref 3.77–5.28)
RDW: 14 % (ref 12.3–15.4)
WBC: 8.9 10*3/uL (ref 3.4–10.8)

## 2016-07-14 LAB — VITAMIN D 25 HYDROXY (VIT D DEFICIENCY, FRACTURES): VIT D 25 HYDROXY: 30.7 ng/mL (ref 30.0–100.0)

## 2016-08-01 ENCOUNTER — Encounter: Payer: Self-pay | Admitting: Emergency Medicine

## 2016-08-01 ENCOUNTER — Emergency Department: Payer: BC Managed Care – PPO

## 2016-08-01 ENCOUNTER — Emergency Department
Admission: EM | Admit: 2016-08-01 | Discharge: 2016-08-01 | Disposition: A | Discharge status: Home / Self Care | Payer: BC Managed Care – PPO | Attending: Emergency Medicine | Admitting: Emergency Medicine

## 2016-08-01 DIAGNOSIS — Z79899 Other long term (current) drug therapy: Secondary | ICD-10-CM | POA: Diagnosis not present

## 2016-08-01 DIAGNOSIS — R109 Unspecified abdominal pain: Secondary | ICD-10-CM | POA: Diagnosis present

## 2016-08-01 DIAGNOSIS — N201 Calculus of ureter: Secondary | ICD-10-CM | POA: Insufficient documentation

## 2016-08-01 LAB — URINALYSIS, COMPLETE (UACMP) WITH MICROSCOPIC
Bacteria, UA: NONE SEEN
Bilirubin Urine: NEGATIVE
Glucose, UA: NEGATIVE mg/dL
Hgb urine dipstick: NEGATIVE
Ketones, ur: NEGATIVE mg/dL
Leukocytes, UA: NEGATIVE
Nitrite: NEGATIVE
PH: 6.5 (ref 5.0–8.0)
Protein, ur: NEGATIVE mg/dL
SPECIFIC GRAVITY, URINE: 1.02 (ref 1.005–1.030)

## 2016-08-01 LAB — COMPREHENSIVE METABOLIC PANEL
ALBUMIN: 4.8 g/dL (ref 3.5–5.0)
ALK PHOS: 114 U/L (ref 38–126)
ALT: 39 U/L (ref 14–54)
ANION GAP: 9 (ref 5–15)
AST: 33 U/L (ref 15–41)
BILIRUBIN TOTAL: 0.8 mg/dL (ref 0.3–1.2)
BUN: 13 mg/dL (ref 6–20)
CALCIUM: 10.1 mg/dL (ref 8.9–10.3)
CO2: 25 mmol/L (ref 22–32)
Chloride: 105 mmol/L (ref 101–111)
Creatinine, Ser: 0.7 mg/dL (ref 0.44–1.00)
GFR calc Af Amer: >60 mL/min (ref >60)
GFR calc non Af Amer: >60 mL/min (ref >60)
GLUCOSE: 108 mg/dL — AB (ref 65–99)
Potassium: 3.8 mmol/L (ref 3.5–5.1)
SODIUM: 139 mmol/L (ref 135–145)
TOTAL PROTEIN: 8.1 g/dL (ref 6.5–8.1)

## 2016-08-01 LAB — CBC
HCT: 45.4 % (ref 35.0–47.0)
HEMOGLOBIN: 15.1 g/dL (ref 12.0–16.0)
MCH: 28.5 pg (ref 26.0–34.0)
MCHC: 33.2 g/dL (ref 32.0–36.0)
MCV: 85.7 fL (ref 80.0–100.0)
Platelets: 190 10*3/uL (ref 150–440)
RBC: 5.3 MIL/uL — ABNORMAL HIGH (ref 3.80–5.20)
RDW: 13.8 % (ref 11.5–14.5)
WBC: 12.2 10*3/uL — ABNORMAL HIGH (ref 3.6–11.0)

## 2016-08-01 LAB — LIPASE, BLOOD: Lipase: 20 U/L (ref 11–51)

## 2016-08-01 LAB — PREGNANCY, URINE: PREG TEST UR: NEGATIVE

## 2016-08-01 MED ORDER — IOPAMIDOL (ISOVUE-300) INJECTION 61%
100.0000 mL | Freq: Once | INTRAVENOUS | Status: AC | PRN
  Administered 2016-08-01: 100 mL via INTRAVENOUS

## 2016-08-01 MED ORDER — OXYCODONE-ACETAMINOPHEN 5-325 MG PO TABS
1.0000 | ORAL_TABLET | Freq: Once | ORAL | Status: AC
Start: 2016-08-01 — End: 2016-08-01
  Administered 2016-08-01: 1 via ORAL
  Filled 2016-08-01: qty 1

## 2016-08-01 MED ORDER — OXYCODONE-ACETAMINOPHEN 5-325 MG PO TABS: 1.0000 | ORAL_TABLET | ORAL | 0 refills | Status: AC | PRN

## 2016-08-01 MED ORDER — SODIUM CHLORIDE 0.9 % IV BOLUS (SEPSIS)
1000.0000 mL | Freq: Once | INTRAVENOUS | Status: AC
  Administered 2016-08-01: 1000 mL via INTRAVENOUS

## 2016-08-01 MED ORDER — IOPAMIDOL (ISOVUE-300) INJECTION 61%
30.0000 mL | Freq: Once | INTRAVENOUS | Status: AC | PRN
  Administered 2016-08-01: 30 mL via ORAL

## 2016-08-01 MED ORDER — MORPHINE SULFATE (PF) 4 MG/ML IV SOLN
4.0000 mg | Freq: Once | INTRAVENOUS | Status: AC
  Administered 2016-08-01: 4 mg via INTRAVENOUS
  Filled 2016-08-01: qty 1

## 2016-08-01 MED ORDER — HYDROMORPHONE HCL 1 MG/ML IJ SOLN
1.0000 mg | Freq: Once | INTRAMUSCULAR | Status: AC
  Administered 2016-08-01: 1 mg via INTRAVENOUS
  Filled 2016-08-01: qty 1

## 2016-08-01 MED ORDER — METOCLOPRAMIDE HCL 5 MG/ML IJ SOLN
10.0000 mg | Freq: Once | INTRAMUSCULAR | Status: AC
  Administered 2016-08-01: 10 mg via INTRAVENOUS
  Filled 2016-08-01: qty 2

## 2016-08-01 MED ORDER — ONDANSETRON HCL 4 MG PO TABS: 4.0000 mg | ORAL_TABLET | Freq: Three times a day (TID) | ORAL | 0 refills | Status: AC | PRN

## 2016-08-01 MED ORDER — ONDANSETRON HCL 4 MG/2ML IJ SOLN
4.0000 mg | Freq: Once | INTRAMUSCULAR | Status: AC
  Administered 2016-08-01: 4 mg via INTRAVENOUS
  Filled 2016-08-01: qty 2

## 2016-08-01 NOTE — Discharge Instructions (Signed)
Please seek medical attention for any high fevers, chest pain, shortness of breath, change in behavior, persistent vomiting, bloody stool or any other new or concerning symptoms.  

## 2016-08-01 NOTE — ED Provider Notes (Signed)
[facility] Emergency Department Provider Note   ____________________________________________   I have reviewed the triage vital signs and the nursing notes.   HISTORY  Chief Complaint Abdominal Pain   History limited by: Not Limited   HPI Alison Parks is a 41 y.o. female who presents to the emergency department today because of concern for abdominal pain. The pain started this morning. Initially it was a mild pain and than became intense. She describes it as cramping. It is severe. It has been accompanied by nausea and vomiting. The patient had a little difficulty with urination this morning. Denies similar pain in the past. Underwent cesarean section roughly 2 months ago. States she has been having fevers today.   History reviewed. No pertinent past medical history.  Patient Active Problem List   Diagnosis Date Noted  . Delivery by cesarean section of full-term infant 06/02/2016  . Increased BMI 11/03/2015    Past Surgical History:  Procedure Laterality Date  . CESAREAN SECTION N/A 06/02/2016   Procedure: CESAREAN SECTION;  Surgeon: Linzie Collin, MD;  Location: [facility] ORS;  Service: Obstetrics;  Laterality: N/A;  . NO PAST SURGERIES      Prior to Admission medications   Medication Sig Start Date End Date Taking? Authorizing Provider  norethindrone (MICRONOR,CAMILA,ERRIN) 0.35 MG tablet Take 1 tablet (0.35 mg total) by mouth daily. 06/04/16   Melody Suzan Nailer, CNM  Prenatal Vit-Fe Fumarate-FA (PRENATAL MULTIVITAMIN) TABS tablet Take 1 tablet by mouth daily at 12 noon.    Historical Provider, MD    Allergies Amoxicillin  Family History  Problem Relation Age of Onset  . Diabetes Father   . Congestive Heart Failure Father   . Breast cancer Paternal Grandmother   . Ovarian cancer Neg Hx   . Colon cancer Neg Hx     Social History Social History  Substance Use Topics  . Smoking status: Never Smoker  . Smokeless tobacco: Never Used  .  Alcohol use No    Review of Systems  Constitutional: Negative for fever. Cardiovascular: Negative for chest pain. Respiratory: Negative for shortness of breath. Gastrointestinal: Positive for abdominal pain, nausea and vomiting.  Genitourinary: Negative for dysuria. Musculoskeletal: Negative for back pain. Skin: Negative for rash. Neurological: Negative for headaches, focal weakness or numbness.  10-point ROS otherwise negative.  ____________________________________________   PHYSICAL EXAM:  VITAL SIGNS: ED Triage Vitals  Enc Vitals Group     BP 08/01/16 1136 (!) 131/115     Pulse Rate 08/01/16 1136 61     Resp 08/01/16 1136 16     Temp 08/01/16 1136 97.8 F (36.6 C)     Temp Source 08/01/16 1136 Oral     SpO2 08/01/16 1136 100 %     Weight 08/01/16 1136 225 lb (102.1 kg)     Height 08/01/16 1136 5\' 8"  (1.727 m)     Head Circumference --      Peak Flow --      Pain Score 08/01/16 1135 10   Constitutional: Alert and oriented. Appears uncomfortable.  Eyes: Conjunctivae are normal. Normal extraocular movements. ENT   Head: Normocephalic and atraumatic.   Nose: No congestion/rhinnorhea.   Mouth/Throat: Mucous membranes are moist.   Neck: No stridor. Hematological/Lymphatic/Immunilogical: No cervical lymphadenopathy. Cardiovascular: Normal rate, regular rhythm.  No murmurs, rubs, or gallops.  Respiratory: Normal respiratory effort without tachypnea nor retractions. Breath sounds are clear and equal bilaterally. No wheezes/rales/rhonchi. Gastrointestinal: Soft and non tender. No rebound. No guarding.  Genitourinary: Deferred  Musculoskeletal: Normal range of motion in all extremities. No lower extremity edema. Neurologic:  Normal speech and language. No gross focal neurologic deficits are appreciated.  Skin:  Skin is warm, dry and intact. No rash noted. Psychiatric: Mood and affect are normal. Speech and behavior are normal. Patient exhibits appropriate  insight and judgment.  ____________________________________________    LABS (pertinent positives/negatives)  Labs Reviewed  COMPREHENSIVE METABOLIC PANEL - Abnormal; Notable for the following:       Result Value   Glucose, Bld 108 (*)    All other components within normal limits  CBC - Abnormal; Notable for the following:    WBC 12.2 (*)    RBC 5.30 (*)    All other components within normal limits  URINALYSIS, COMPLETE (UACMP) WITH MICROSCOPIC - Abnormal; Notable for the following:    Squamous Epithelial / LPF 6-30 (*)    All other components within normal limits  LIPASE, BLOOD  PREGNANCY, URINE     ____________________________________________   EKG  None  ____________________________________________    RADIOLOGY  CT abd/pel IMPRESSION: 3 x 4 mm obstructing stone distal right ureter. No other renal calculi.  Normal appendix.  ____________________________________________   PROCEDURES  Procedures  ____________________________________________   INITIAL IMPRESSION / ASSESSMENT AND PLAN / ED COURSE  Pertinent labs & imaging results that were available during my care of the patient were reviewed by me and considered in my medical decision making (see chart for details).  Patient presented to the emergency department today with concerns for right flank pain. Patient's urine did not have any red blood cells so a CT scan with contrast was obtained. This did show a stone at the UVJ. Patient's pain and nausea was able to be controlled. No signs of any infection. Patient did feel comfortable trying to manage at home and following up with urology.  ____________________________________________   FINAL CLINICAL IMPRESSION(S) / ED DIAGNOSES  Final diagnoses:  Ureteral stone     Note: This dictation was prepared with Dragon dictation. Any transcriptional errors that result from this process are unintentional     Phineas SemenGraydon Fartun Paradiso, MD 08/01/16 2031

## 2016-08-01 NOTE — ED Triage Notes (Signed)
Ptwith right lower abd pain rad to right flank area.  Sudden onset 8am.  Some vomiting.

## 2016-08-01 NOTE — ED Notes (Signed)
Patient transported to CT 

## 2016-08-01 NOTE — ED Notes (Signed)
Unable to tolerate CT contrast. Dr Derrill Kaygoodman notified.

## 2016-08-01 NOTE — ED Notes (Signed)
Pt vomiting. Dr Derrill Kaygoodman notified.

## 2016-09-12 ENCOUNTER — Encounter: Payer: Self-pay | Admitting: Obstetrics and Gynecology

## 2018-02-26 IMAGING — CT CT ABD-PELV W/ CM
2 of 4 series · 16 of 46 positions shown, 18 images · IV contrast (APPLIED)
Comparison: None.

CLINICAL DATA: Right lower quadrant pain

EXAM:
CT ABDOMEN AND PELVIS WITH CONTRAST
TECHNIQUE: Multidetector CT imaging of the abdomen and pelvis was performed
using the standard protocol following bolus administration of
intravenous contrast.
CONTRAST:  100mL 5CUAJS-A33 IOPAMIDOL ([id number]-A33) INJECTION 61%

[Series 2: axial st · axial · 0.84mm/px · z∈[-1018,-542]mm · 13 of 105 slices shown, 15 images]
[im 5/105  soft-tissue]
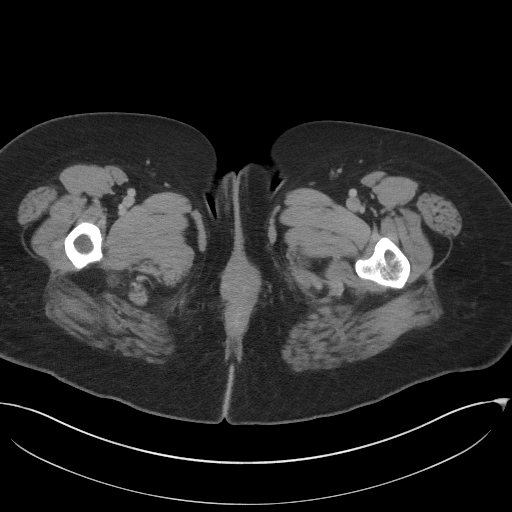
[im 5/105  bone]
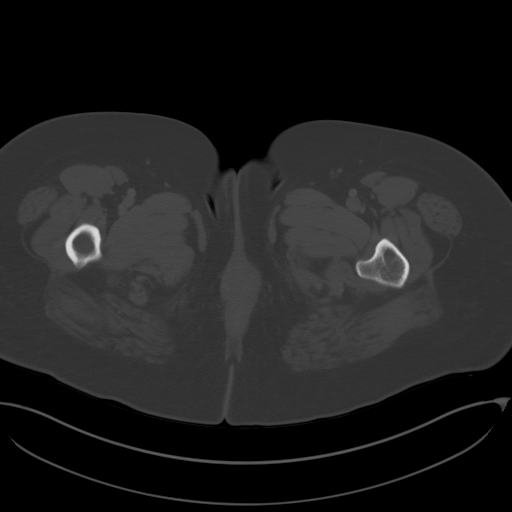
[im 14/105  soft-tissue]
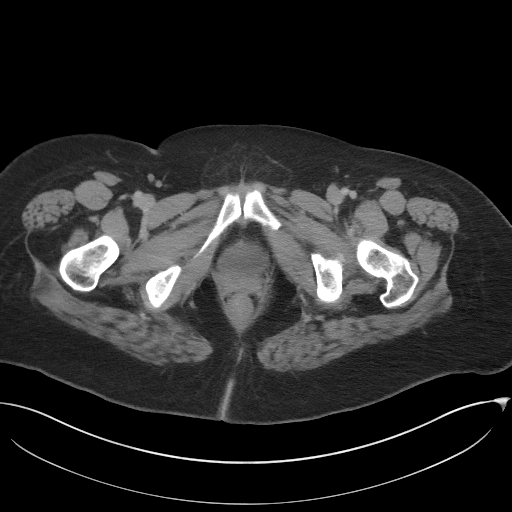
[im 23/105  soft-tissue]
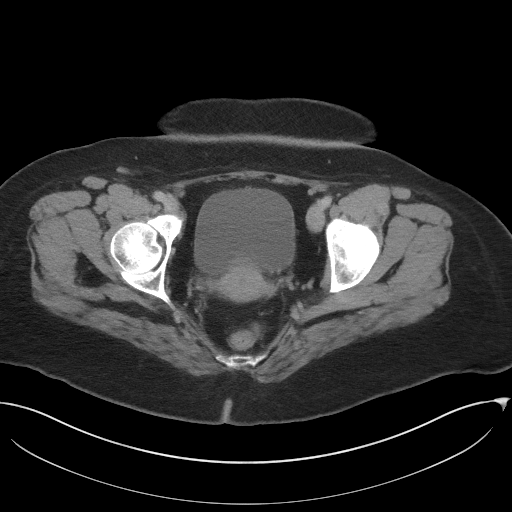
[im 28/105  soft-tissue]
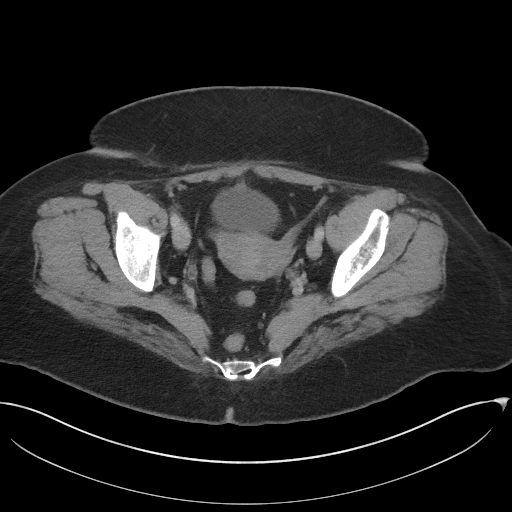
[im 37/105  soft-tissue]
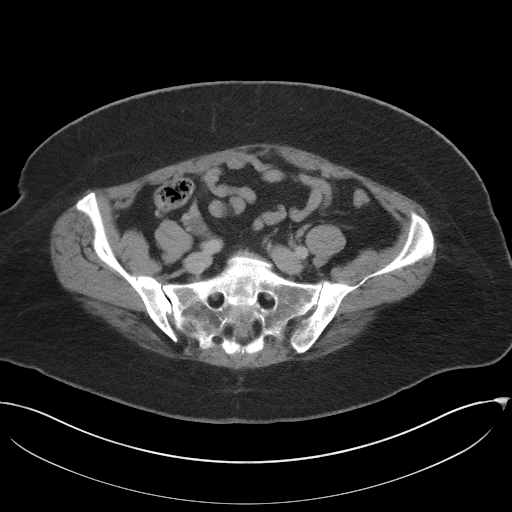
[im 46/105  soft-tissue]
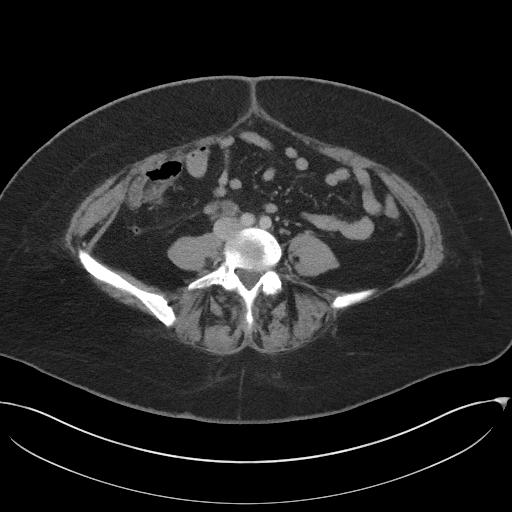
[im 55/105  soft-tissue]
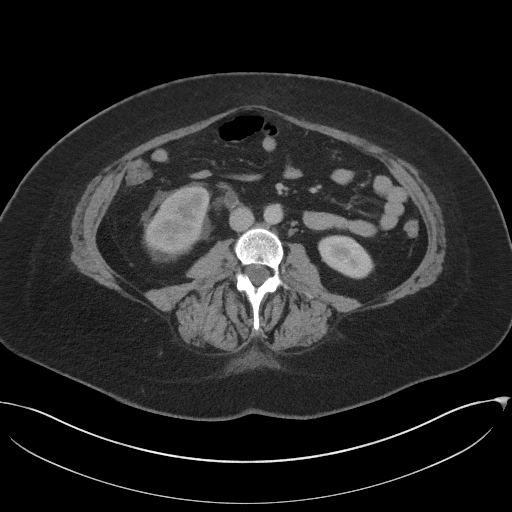
[im 59/105  soft-tissue]
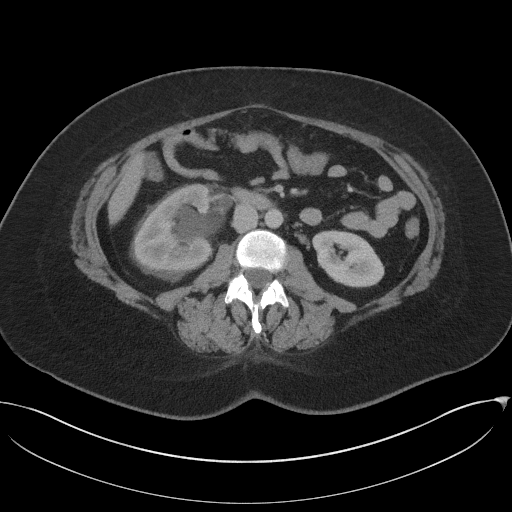
[im 68/105  soft-tissue]
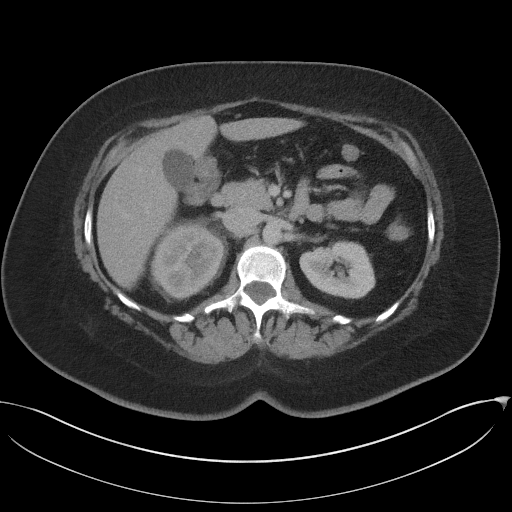
[im 68/105  bone]
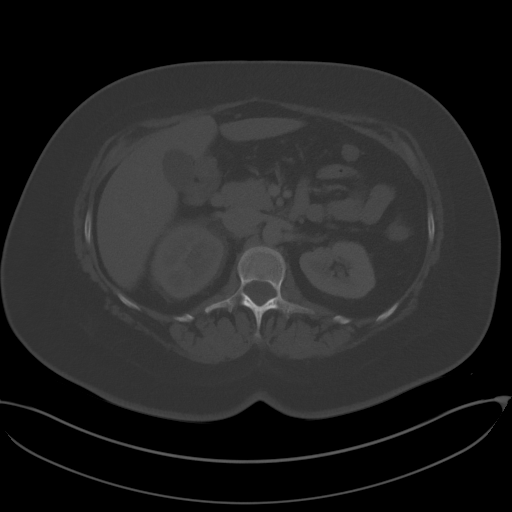
[im 77/105  soft-tissue]
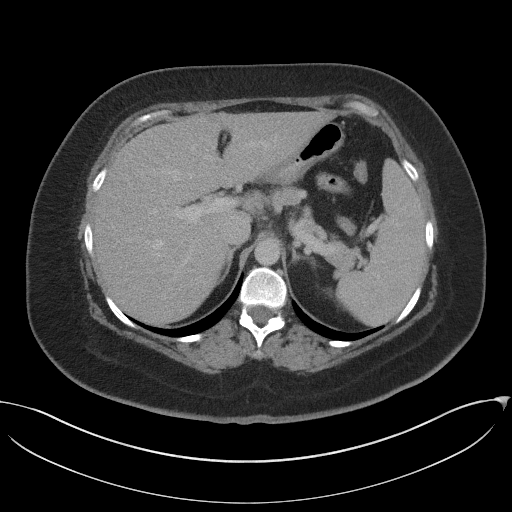
[im 82/105  soft-tissue]
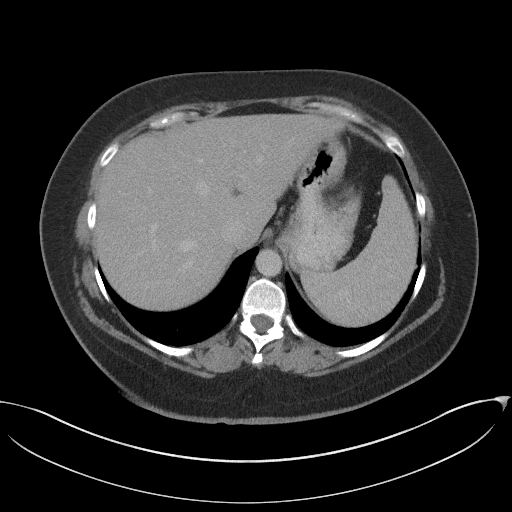
[im 91/105  soft-tissue]
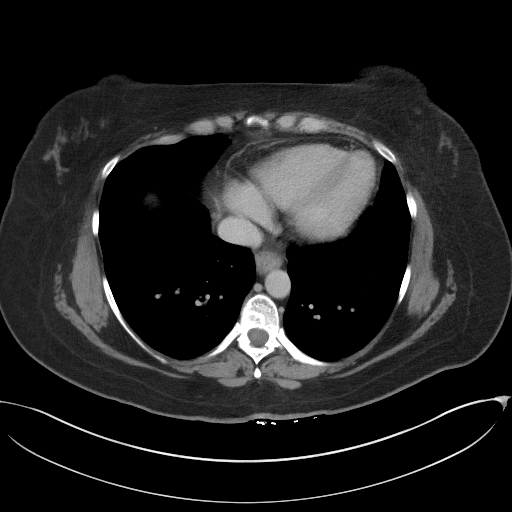
[im 100/105  soft-tissue]
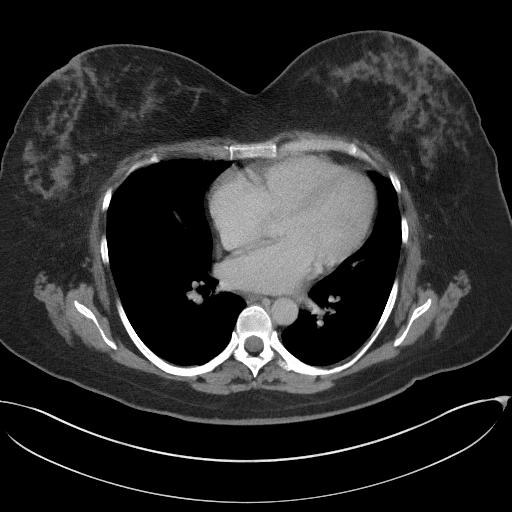

[Series 5: coronal st · coronal · 0.87mm/px · 3 of 95 slices shown]
[im 32/95  soft-tissue]
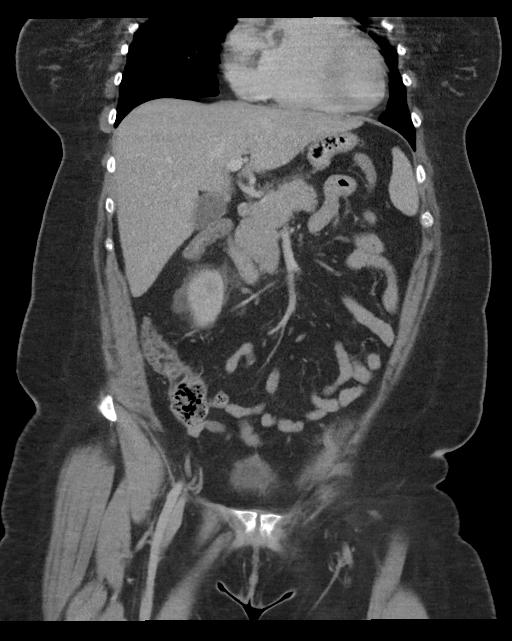
[im 42/95  soft-tissue]
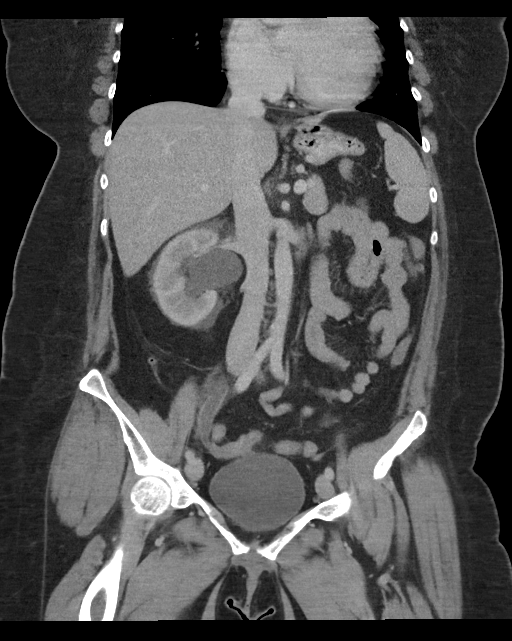
[im 53/95  soft-tissue]
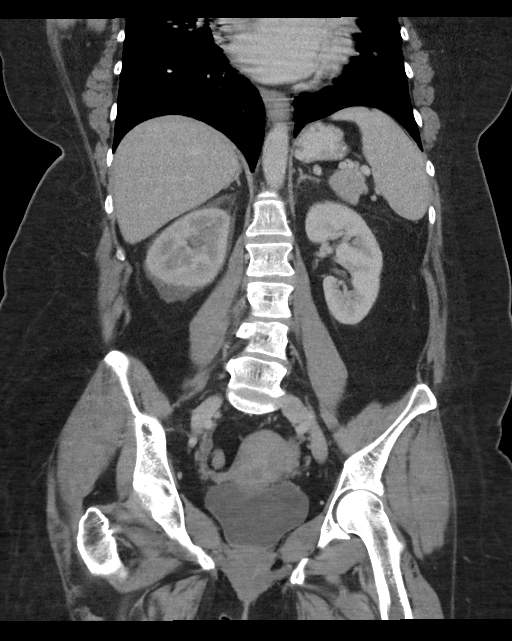

[16 of 46 positions shown; findings below may reference images not displayed]

FINDINGS: Lower chest: Lung bases clear.

Hepatobiliary: Negative

Pancreas: Negative

Spleen: Negative

Adrenals/Urinary Tract: Moderate hydronephrosis and hydroureter on
the right. Perinephric fluid on the right due to obstruction. Right
ureter is dilated down to the bladder were there is a 3 x 4 mm
obstructing stone at the right UPJ. No other renal calculi. Left
kidney normal.

Stomach/Bowel: Negative for bowel obstruction. Small hiatal hernia.
Negative for bowel mass or edema. Normal appendix.

Vascular/Lymphatic: Negative

Reproductive: Normal uterus and adnexum.  No pelvic mass.

Other: No free fluid.

Musculoskeletal: Disc degeneration and spondylosis L5-S1. Partial
butterfly vertebra T9. No acute skeletal abnormality.
IMPRESSION: 3 x 4 mm obstructing stone distal right ureter. No other renal
calculi.

Normal appendix.
